# Patient Record
Sex: Female | Born: 1995 | Race: Black or African American | Hispanic: No | Marital: Single | Smoking: Light tobacco smoker
Health system: Southern US, Community
[De-identification: ages and names within clinical notes are randomized; demographics above are authoritative.]

## PROBLEM LIST (undated history)

## (undated) DIAGNOSIS — J020 Streptococcal pharyngitis: Secondary | ICD-10-CM

## (undated) DIAGNOSIS — F909 Attention-deficit hyperactivity disorder, unspecified type: Secondary | ICD-10-CM

## (undated) HISTORY — DX: Attention-deficit hyperactivity disorder, unspecified type: F90.9

---
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
## 2015-10-11 ENCOUNTER — Encounter: Payer: Self-pay | Admitting: Family Medicine

## 2015-10-11 ENCOUNTER — Ambulatory Visit (INDEPENDENT_AMBULATORY_CARE_PROVIDER_SITE_OTHER): Payer: 59 | Admitting: Family Medicine

## 2015-10-11 VITALS — BP 134/86 | Temp 99.3°F | Ht 64.0 in | Wt 377.0 lb

## 2015-10-11 DIAGNOSIS — Z139 Encounter for screening, unspecified: Secondary | ICD-10-CM | POA: Diagnosis not present

## 2015-10-11 DIAGNOSIS — J0100 Acute maxillary sinusitis, unspecified: Secondary | ICD-10-CM | POA: Diagnosis not present

## 2015-10-11 MED ORDER — NAPROXEN 500 MG PO TABS: ORAL_TABLET | ORAL | Status: DC

## 2015-10-11 MED ORDER — AZITHROMYCIN 250 MG PO TABS: ORAL_TABLET | ORAL | Status: DC

## 2015-10-11 NOTE — Progress Notes (Signed)
   Subjective:    Patient ID: Madison Gates, female    DO: 1995/12/17, 20 y.o.   MRM: [id number]  Sinusitis This is a new problem. The current episode started in the past 7 days. Associated symptoms include headaches, sinus pressure and a sore throat. (Runny nose) Treatments tried: Theraflu  The treatment provided no relief.   Usually no bp issues , blood pressure repeat within normal limits  7 days progression headache frontal congestion. Diminished energy , achiness occasional cough. Sore throat worse in the morning.    No sig fever  Hard time sleepoing. Nose stoppedd up  Felt bad Patient also has C/O of irregular menstruation. Patient states that she has been experiencing a menstrual cycle for 2 1/2 months.   Of an on menst cycles abnormalities  Not sexualy active,      Review of Systems  HENT: Positive for sinus pressure and sore throat.   Neurological: Positive for headaches.       Objective:   Physical Exam   alert mild malaise. H&T moderate his congestion frontal tenderness. Significant morbid obesity present. Blood pressure repeat good lungs clear heart regular in rhythm.      Assessment & Plan:   impression #1 rhinosinusitis #2 morbid obesity and worsening #3 menstrual irregularities chronic plan antibiotics prescribed. Symptom care discussed. Strongly encouraged screening physical  With Eber JonesCarolyn plus focus on menstrual irregularities and morbid obesity.we'll do blood work appropriate for age and obesity WSL

## 2015-10-30 ENCOUNTER — Encounter: Payer: 59 | Admitting: Nurse Practitioner

## 2015-12-11 ENCOUNTER — Other Ambulatory Visit: Payer: Self-pay | Admitting: Family Medicine

## 2015-12-12 MED ORDER — NAPROXEN 500 MG PO TABS: ORAL_TABLET | ORAL | Status: DC

## 2016-03-25 ENCOUNTER — Ambulatory Visit (INDEPENDENT_AMBULATORY_CARE_PROVIDER_SITE_OTHER): Payer: 59 | Admitting: Nurse Practitioner

## 2016-03-25 ENCOUNTER — Encounter: Payer: Self-pay | Admitting: Nurse Practitioner

## 2016-03-25 VITALS — BP 132/86 | Ht 64.0 in | Wt 374.8 lb

## 2016-03-25 DIAGNOSIS — N939 Abnormal uterine and vaginal bleeding, unspecified: Secondary | ICD-10-CM

## 2016-03-25 DIAGNOSIS — E162 Hypoglycemia, unspecified: Secondary | ICD-10-CM | POA: Diagnosis not present

## 2016-03-25 DIAGNOSIS — L83 Acanthosis nigricans: Secondary | ICD-10-CM

## 2016-03-25 DIAGNOSIS — Z13220 Encounter for screening for lipoid disorders: Secondary | ICD-10-CM | POA: Diagnosis not present

## 2016-03-25 MED ORDER — LEVONORGEST-ETH ESTRAD 91-DAY 0.15-0.03 &0.01 MG PO TABS: 1.0000 | ORAL_TABLET | Freq: Every day | ORAL | Status: DC

## 2016-03-25 NOTE — Progress Notes (Signed)
Subjective:  Presents for complaints of constant menstrual bleeding for the past 3 months. Varies in amount. Some cramping. No sexual partner for at least 2 years. Would like to start birth control pills. Denies any family history of diabetes. Light smoker mainly on the weekends. Also complaints of spells of "anxiety" that occur around 4 PM every day. Occurs in a variety of settings, has not noticed any specific triggers. Eats lunch around 12-1 PM daily. Usually eats fast food. Starts with feeling nauseated than a mild headache and slight tremors at times. No syncopal episodes.  Objective:   BP 132/86 mmHg  Ht 5\' 4"  (1.626 m)  Wt 374 lb 12.8 oz (170.008 kg)  BMI 64.30 kg/m2 NAD. Alert, oriented. Lungs clear. Heart regular rate rhythm. Extreme obesity noted. Acanthosis nigricans noted on the posterior neck area.  Assessment:  Problem List Items Addressed This Visit      Musculoskeletal and Integument   Acanthosis nigricans   Relevant Orders   Hepatic function panel   Hemoglobin A1c     Genitourinary   Abnormal uterine bleeding - Primary   Relevant Orders   CBC with Differential/Platelet     Other   Morbid obesity (HCC)   Relevant Orders   Hepatic function panel   TSH    Other Visit Diagnoses    Hypoglycemia   Probable     Relevant Orders    Hepatic function panel    Basic metabolic panel    TSH    Insulin, Fasting    Screening, lipid        Relevant Orders    Lipid panel      Plan:  Meds ordered this encounter  Medications  . Levonorgestrel-Ethinyl Estradiol (AMETHIA,CAMRESE) 0.15-0.03 &0.01 MG tablet    Sig: Take 1 tablet by mouth daily.    Dispense:  1 Package    Refill:  4    Order Specific Question:  Supervising Provider    Answer:  Riccardo DubinLUKING, WILLIAM S [[id number]]   Discuss birth control options. Wishes to start 3 month birth control pill. Cautioned about risk of blood clots which are increased with smoking. Explained that her anxiety symptoms may be due to low blood  sugar. Recommend a healthy high-protein snack around 3:00 in the afternoon to see if this will help stop symptoms. Lab work pending. Start birth control pill today.

## 2016-04-17 ENCOUNTER — Encounter: Payer: Self-pay | Admitting: Nurse Practitioner

## 2016-06-06 ENCOUNTER — Other Ambulatory Visit: Payer: Self-pay | Admitting: Family Medicine

## 2016-06-06 MED ORDER — NAPROXEN 500 MG PO TABS: ORAL_TABLET | ORAL | 0 refills | Status: DC

## 2016-07-30 ENCOUNTER — Encounter: Payer: Self-pay | Admitting: Nurse Practitioner

## 2016-11-13 ENCOUNTER — Encounter: Payer: Self-pay | Admitting: Nurse Practitioner

## 2016-11-18 ENCOUNTER — Other Ambulatory Visit: Payer: Self-pay | Admitting: Nurse Practitioner

## 2016-11-18 MED ORDER — NORETHIN-ETH ESTRAD-FE BIPHAS 1 MG-10 MCG / 10 MCG PO TABS: 1.0000 | ORAL_TABLET | Freq: Every day | ORAL | 2 refills | Status: DC

## 2017-01-16 ENCOUNTER — Encounter: Payer: Self-pay | Admitting: Nurse Practitioner

## 2017-01-20 ENCOUNTER — Other Ambulatory Visit: Payer: Self-pay | Admitting: Nurse Practitioner

## 2017-01-23 ENCOUNTER — Other Ambulatory Visit: Payer: Self-pay | Admitting: Nurse Practitioner

## 2017-01-23 MED ORDER — NORETHINDRONE-ETH ESTRADIOL 1-35 MG-MCG PO TABS
1.0000 | ORAL_TABLET | Freq: Every day | ORAL | 11 refills | Status: DC
Start: 2017-01-23 — End: 2017-10-14

## 2017-03-09 ENCOUNTER — Encounter (HOSPITAL_COMMUNITY): Payer: Self-pay | Admitting: Emergency Medicine

## 2017-03-09 ENCOUNTER — Emergency Department (HOSPITAL_COMMUNITY)
Admission: EM | Admit: 2017-03-09 | Discharge: 2017-03-09 | Disposition: A | Discharge status: Home / Self Care | Payer: Medicaid Other | Attending: Emergency Medicine | Admitting: Emergency Medicine

## 2017-03-09 DIAGNOSIS — F909 Attention-deficit hyperactivity disorder, unspecified type: Secondary | ICD-10-CM | POA: Insufficient documentation

## 2017-03-09 DIAGNOSIS — F17200 Nicotine dependence, unspecified, uncomplicated: Secondary | ICD-10-CM | POA: Insufficient documentation

## 2017-03-09 DIAGNOSIS — Z79899 Other long term (current) drug therapy: Secondary | ICD-10-CM | POA: Insufficient documentation

## 2017-03-09 DIAGNOSIS — J039 Acute tonsillitis, unspecified: Secondary | ICD-10-CM | POA: Insufficient documentation

## 2017-03-09 HISTORY — DX: Streptococcal pharyngitis: J02.0

## 2017-03-09 MED ORDER — AMOXICILLIN 500 MG PO CAPS: 500.0000 mg | ORAL_CAPSULE | Freq: Three times a day (TID) | ORAL | 0 refills | Status: DC

## 2017-03-09 NOTE — ED Triage Notes (Signed)
Pt c/o sorethroat x 2 days. Redness with white spots noted to throat. nad

## 2017-03-09 NOTE — ED Provider Notes (Signed)
[facility]EMERGENCY DEPT Provider Note   CSN: [id number] Arrival date & time: 03/09/17  1704     History   Chief Complaint Chief Complaint  Patient presents with  . Sore Throat    HPI Madison Gates is a 21 y.o. female.  The history is provided by the patient. No language interpreter was used.  Sore Throat  This is a new problem. The current episode started more than 2 days ago. The problem occurs constantly. The problem has not changed since onset.Pertinent negatives include no headaches. Nothing aggravates the symptoms. Nothing relieves the symptoms. She has tried nothing for the symptoms. The treatment provided no relief.    Past Medical History:  Diagnosis Date  . ADHD (attention deficit hyperactivity disorder)   . Strep throat     Patient Active Problem List   Diagnosis Date Noted  . Abnormal uterine bleeding 03/25/2016  . Morbid obesity (HCC) 03/25/2016  . Acanthosis nigricans 03/25/2016    History reviewed. No pertinent surgical history.  OB History    No data available       Home Medications    Prior to Admission medications   Medication Sig Start Date End Date Taking? Authorizing Provider  naproxen (NAPROSYN) 500 MG tablet Take 1 tablet BID as needed for pain. 06/06/16   Campbell RichesHoskins, Carolyn C, NP  norethindrone-ethinyl estradiol 1/35 (PIRMELLA 1/35) tablet Take 1 tablet by mouth daily. 01/23/17   Campbell RichesHoskins, Carolyn C, NP    Family History History reviewed. No pertinent family history.  Social History Social History  Substance Use Topics  . Smoking status: Light Tobacco Smoker  . Smokeless tobacco: Never Used  . Alcohol use No     Allergies   Concerta [methylphenidate]   Review of Systems Review of Systems  Neurological: Negative for headaches.  All other systems reviewed and are negative.    Physical Exam Updated Vital Signs BP 136/75 (BP Location: Left Arm)   Pulse (!) 113   Temp 99.6 F (37.6 C) (Oral)   Resp (!) 21   LMP  02/16/2017   SpO2 100%   Physical Exam  Constitutional: She appears well-developed and well-nourished. No distress.  HENT:  Head: Normocephalic and atraumatic.  Mouth/Throat: Oropharyngeal exudate present.  tonsilsl swollen   Eyes: Conjunctivae are normal.  Neck: Neck supple.  Cardiovascular: Normal rate and regular rhythm.   No murmur heard. Pulmonary/Chest: Effort normal and breath sounds normal. No respiratory distress.  Abdominal: Soft. There is no tenderness.  Musculoskeletal: She exhibits no edema.  Neurological: She is alert.  Skin: Skin is warm and dry.  Psychiatric: She has a normal mood and affect.  Nursing note and vitals reviewed.    ED Treatments / Results  Labs (all labs ordered are listed, but only abnormal results are displayed) Labs Reviewed - No data to display  EKG  EKG Interpretation None       Radiology No results found.  Procedures Procedures (including critical care time)  Medications Ordered in ED Medications - No data to display   Initial Impression / Assessment and Plan / ED Course  I have reviewed the triage vital signs and the nursing notes.  Pertinent labs & imaging results that were available during my care of the patient were reviewed by me and considered in my medical decision making (see chart for details).       Final Clinical Impressions(s) / ED Diagnoses   Final diagnoses:  Tonsillitis    New Prescriptions Discharge Medication List as of 03/09/2017  6:16 PM    START taking these medications   Details  amoxicillin (AMOXIL) 500 MG capsule Take 1 capsule (500 mg total) by mouth 3 (three) times daily., Starting Sun 03/09/2017, Print      An After Visit Summary was printed and given to the patient.   Elson Areas, PA-C 03/09/17 Zollie Pee    Eber Hong, MD 03/11/17 1014

## 2017-03-09 NOTE — Discharge Instructions (Signed)
Return if any problems.

## 2017-08-21 ENCOUNTER — Encounter: Payer: Self-pay | Admitting: Family Medicine

## 2017-10-14 ENCOUNTER — Ambulatory Visit (INDEPENDENT_AMBULATORY_CARE_PROVIDER_SITE_OTHER): Payer: Medicaid Other | Admitting: Family Medicine

## 2017-10-14 ENCOUNTER — Encounter: Payer: Self-pay | Admitting: Family Medicine

## 2017-10-14 VITALS — BP 134/84 | Ht 64.0 in | Wt 337.8 lb

## 2017-10-14 DIAGNOSIS — Z113 Encounter for screening for infections with a predominantly sexual mode of transmission: Secondary | ICD-10-CM

## 2017-10-14 DIAGNOSIS — Z124 Encounter for screening for malignant neoplasm of cervix: Secondary | ICD-10-CM | POA: Diagnosis not present

## 2017-10-14 DIAGNOSIS — Z0000 Encounter for general adult medical examination without abnormal findings: Secondary | ICD-10-CM

## 2017-10-14 DIAGNOSIS — Z0001 Encounter for general adult medical examination with abnormal findings: Secondary | ICD-10-CM | POA: Diagnosis not present

## 2017-10-14 DIAGNOSIS — Z13220 Encounter for screening for lipoid disorders: Secondary | ICD-10-CM

## 2017-10-14 DIAGNOSIS — R5383 Other fatigue: Secondary | ICD-10-CM

## 2017-10-14 DIAGNOSIS — L0292 Furuncle, unspecified: Secondary | ICD-10-CM

## 2017-10-14 DIAGNOSIS — L83 Acanthosis nigricans: Secondary | ICD-10-CM

## 2017-10-14 MED ORDER — DOXYCYCLINE HYCLATE 100 MG PO TABS: 100.0000 mg | ORAL_TABLET | Freq: Two times a day (BID) | ORAL | 0 refills | Status: DC

## 2017-10-14 NOTE — Progress Notes (Signed)
Subjective:    Patient ID: Madison Gates, female    DOB: 1995-10-12, 22 y.o.   MRN: [id number]  HPI The patient comes in today for a wellness visit.    A review of their health history was completed.  A review of medications was also completed.  Any needed refills; none  Eating habits: eating healthy  Falls/  MVA accidents in past few months: none  Regular exercise: yoga  Specialist pt sees on regular basis: none  Preventative health issues were discussed.   Additional concerns: STD testing- keeps getting boils under her arms. Has one now, draining somewhat at times  Was on birth conol came off, emotiona, using condoms. Took pills and did not help cycle and made mood worse and sad all the time   No hx of testing   Cycles fairly heavy, take s no vitamins   - Challenges with recurrent boils.  Currently experiencing 1.  Left armpit.  With drainage.  Patient wonders why she gets these of note had a blood work ordered over a year ago which she did not do   Work at [employer] still in school major in communication   Doing some exercise daily      Review of Systems  Constitutional: Negative for activity change, appetite change and fatigue.  HENT: Negative for congestion and rhinorrhea.   Eyes: Negative for discharge.  Respiratory: Negative for cough, chest tightness and wheezing.   Cardiovascular: Negative for chest pain.  Gastrointestinal: Negative for abdominal pain, blood in stool and vomiting.  Endocrine: Negative for polyphagia.  Genitourinary: Negative for difficulty urinating and frequency.  Musculoskeletal: Negative for neck pain.  Skin: Negative for color change.  Allergic/Immunologic: Negative for environmental allergies and food allergies.  Neurological: Negative for weakness and headaches.  Psychiatric/Behavioral: Negative for agitation and behavioral problems.  All other systems reviewed and are negative.      Objective:   Physical Exam   Constitutional: She is oriented to person, place, and time. She appears well-developed and well-nourished.  Obesity morbid  HENT:  Head: Normocephalic and atraumatic.  Right Ear: External ear normal.  Left Ear: External ear normal.  Eyes: Right eye exhibits no discharge. Left eye exhibits no discharge.  Neck: Normal range of motion. No tracheal deviation present.  Cardiovascular: Normal rate, regular rhythm, normal heart sounds and intact distal pulses. Exam reveals no gallop.  No murmur heard. Pulmonary/Chest: Effort normal and breath sounds normal. No stridor. No respiratory distress. She has no wheezes. She has no rales.  Abdominal: Soft. Bowel sounds are normal. She exhibits no distension and no mass. There is no tenderness. There is no rebound and no guarding.  Musculoskeletal: Normal range of motion. She exhibits no edema or tenderness.  Lymphadenopathy:    She has no cervical adenopathy.  Neurological: She is alert and oriented to person, place, and time. She exhibits normal muscle tone.  Skin: Skin is warm and dry.  Left axillary abscess with active disch mild tend no erythema  Psychiatric: She has a normal mood and affect. Her behavior is normal.  Vitals reviewed.         Assessment & Plan:  Impression 1 wellness exam.  Patient sexually active.  Not interested in hormonal contraceptives at this time.  Has stopped them.  Has partner use condoms aware not as effective STD testing requested.  No symptoms  2.  Recurrent abscesses current one present.  Will treat with antibiotics.  Appropriate blood work.  Will  check sugar and  3.  Morbid obesity strongly encouraged diet and exercise advised if this does not improve soon will be recommending bariatric intervention

## 2017-10-17 LAB — PAP IG, CT-NG, RFX HPV ASCU
CHLAMYDIA, NUC. ACID AMP: POSITIVE — AB
GONOCOCCUS BY NUCLEIC ACID AMP: POSITIVE — AB
PAP Smear Comment: 0

## 2017-10-17 LAB — SPECIMEN STATUS REPORT

## 2017-10-20 MED ORDER — AZITHROMYCIN 500 MG PO TABS: 1000.0000 mg | ORAL_TABLET | Freq: Once | ORAL | 0 refills | Status: AC

## 2017-10-20 MED ORDER — CEFIXIME 400 MG PO CAPS: ORAL_CAPSULE | ORAL | 0 refills | Status: DC

## 2017-10-20 NOTE — Addendum Note (Signed)
Addended by: Margaretha SheffieldBROWN, AUTUMN S on: 10/20/2017 03:05 PM   Modules accepted: Orders

## 2017-10-25 LAB — CBC WITH DIFFERENTIAL/PLATELET
BASOS: 1 %
Basophils Absolute: 0.1 10*3/uL (ref 0.0–0.2)
EOS (ABSOLUTE): 0.2 10*3/uL (ref 0.0–0.4)
EOS: 2 %
HEMATOCRIT: 40.1 % (ref 34.0–46.6)
HEMOGLOBIN: 13.3 g/dL (ref 11.1–15.9)
Immature Grans (Abs): 0 10*3/uL (ref 0.0–0.1)
Immature Granulocytes: 0 %
LYMPHS ABS: 2.4 10*3/uL (ref 0.7–3.1)
Lymphs: 32 %
MCH: 25.7 pg — ABNORMAL LOW (ref 26.6–33.0)
MCHC: 33.2 g/dL (ref 31.5–35.7)
MCV: 77 fL — AB (ref 79–97)
MONOCYTES: 7 %
MONOS ABS: 0.6 10*3/uL (ref 0.1–0.9)
NEUTROS ABS: 4.4 10*3/uL (ref 1.4–7.0)
Neutrophils: 58 %
Platelets: 272 10*3/uL (ref 150–379)
RBC: 5.18 x10E6/uL (ref 3.77–5.28)
RDW: 15 % (ref 12.3–15.4)
WBC: 7.5 10*3/uL (ref 3.4–10.8)

## 2017-10-25 LAB — HIV ANTIBODY (ROUTINE TESTING W REFLEX): HIV SCREEN 4TH GENERATION: NONREACTIVE

## 2017-10-25 LAB — HEMOGLOBIN A1C
ESTIMATED AVERAGE GLUCOSE: 105 mg/dL
Hgb A1c MFr Bld: 5.3 % (ref 4.8–5.6)

## 2017-10-25 LAB — BASIC METABOLIC PANEL
BUN / CREAT RATIO: 13 (ref 9–23)
BUN: 8 mg/dL (ref 6–20)
CO2: 22 mmol/L (ref 20–29)
CREATININE: 0.63 mg/dL (ref 0.57–1.00)
Calcium: 9.5 mg/dL (ref 8.7–10.2)
Chloride: 101 mmol/L (ref 96–106)
GFR, EST AFRICAN AMERICAN: 148 mL/min/{1.73_m2} (ref >59)
GFR, EST NON AFRICAN AMERICAN: 129 mL/min/{1.73_m2} (ref >59)
Glucose: 84 mg/dL (ref 65–99)
Potassium: 4.2 mmol/L (ref 3.5–5.2)
SODIUM: 140 mmol/L (ref 134–144)

## 2017-10-25 LAB — LIPID PANEL
CHOL/HDL RATIO: 4.1 ratio (ref 0.0–4.4)
Cholesterol, Total: 165 mg/dL (ref 100–199)
HDL: 40 mg/dL (ref >39)
LDL Calculated: 110 mg/dL — ABNORMAL HIGH (ref 0–99)
Triglycerides: 73 mg/dL (ref 0–149)
VLDL Cholesterol Cal: 15 mg/dL (ref 5–40)

## 2017-10-25 LAB — HEPATIC FUNCTION PANEL
ALK PHOS: 78 IU/L (ref 39–117)
ALT: 8 IU/L (ref 0–32)
AST: 9 IU/L (ref 0–40)
Albumin: 4.4 g/dL (ref 3.5–5.5)
BILIRUBIN TOTAL: 0.3 mg/dL (ref 0.0–1.2)
BILIRUBIN, DIRECT: 0.1 mg/dL (ref 0.00–0.40)
TOTAL PROTEIN: 7.5 g/dL (ref 6.0–8.5)

## 2017-10-25 LAB — RPR, QUANT+TP ABS (REFLEX): T Pallidum Abs: NEGATIVE

## 2017-10-25 LAB — RPR: RPR: REACTIVE — AB

## 2017-11-06 ENCOUNTER — Encounter: Payer: Self-pay | Admitting: Family Medicine

## 2017-11-13 ENCOUNTER — Encounter: Payer: Self-pay | Admitting: Family Medicine

## 2017-11-14 NOTE — Telephone Encounter (Signed)
Discussed with patient and advised her that the Health department would be contacting her with further instructions. Patient verbalized understanding.

## 2017-12-06 ENCOUNTER — Emergency Department (HOSPITAL_COMMUNITY)
Admission: EM | Admit: 2017-12-06 | Discharge: 2017-12-06 | Disposition: A | Discharge status: Home / Self Care | Payer: Self-pay | Attending: Emergency Medicine | Admitting: Emergency Medicine

## 2017-12-06 ENCOUNTER — Encounter (HOSPITAL_COMMUNITY): Payer: Self-pay

## 2017-12-06 DIAGNOSIS — F17200 Nicotine dependence, unspecified, uncomplicated: Secondary | ICD-10-CM | POA: Insufficient documentation

## 2017-12-06 DIAGNOSIS — J3489 Other specified disorders of nose and nasal sinuses: Secondary | ICD-10-CM | POA: Insufficient documentation

## 2017-12-06 DIAGNOSIS — J0111 Acute recurrent frontal sinusitis: Secondary | ICD-10-CM | POA: Insufficient documentation

## 2017-12-06 DIAGNOSIS — R05 Cough: Secondary | ICD-10-CM | POA: Insufficient documentation

## 2017-12-06 DIAGNOSIS — F909 Attention-deficit hyperactivity disorder, unspecified type: Secondary | ICD-10-CM | POA: Insufficient documentation

## 2017-12-06 MED ORDER — AMOXICILLIN-POT CLAVULANATE 875-125 MG PO TABS: 1.0000 | ORAL_TABLET | Freq: Two times a day (BID) | ORAL | 0 refills | Status: DC

## 2017-12-06 NOTE — ED Provider Notes (Signed)
[facility] Provider Note   CSN: [id number] Arrival date & time: 12/06/17  0844     History   Chief Complaint Chief Complaint  Patient presents with  . Facial Pain    HPI Madison Gates is a 22 y.o. female.  The history is provided by the patient. No language interpreter was used.  URI   This is a new problem. The problem has been gradually worsening. There has been no fever. Associated symptoms include sinus pain and cough. She has tried nothing for the symptoms. The treatment provided no relief.    Past Medical History:  Diagnosis Date  . ADHD (attention deficit hyperactivity disorder)   . Strep throat     Patient Active Problem List   Diagnosis Date Noted  . Abnormal uterine bleeding 03/25/2016  . Morbid obesity (HCC) 03/25/2016  . Acanthosis nigricans 03/25/2016    History reviewed. No pertinent surgical history.  OB History    No data available       Home Medications    Prior to Admission medications   Medication Sig Start Date End Date Taking? Authorizing Provider  amoxicillin-clavulanate (AUGMENTIN) 875-125 MG tablet Take 1 tablet by mouth every 12 (twelve) hours. 12/06/17   Elson AreasSofia, Diesel Lina K, PA-C  Cefixime (SUPRAX) 400 MG CAPS capsule One tablet PO now 10/20/17   Merlyn AlbertLuking, William S, MD  doxycycline (VIBRA-TABS) 100 MG tablet Take 1 tablet (100 mg total) by mouth 2 (two) times daily. 10/14/17   Merlyn AlbertLuking, William S, MD  naproxen (NAPROSYN) 500 MG tablet Take 1 tablet BID as needed for pain. 06/06/16   Campbell RichesHoskins, Carolyn C, NP    Family History No family history on file.  Social History Social History   Tobacco Use  . Smoking status: Light Tobacco Smoker  . Smokeless tobacco: Never Used  Substance Use Topics  . Alcohol use: No    Alcohol/week: 0.0 oz  . Drug use: No     Allergies   Concerta [methylphenidate]   Review of Systems Review of Systems  HENT: Positive for sinus pain.   Respiratory: Positive for cough.   All  other systems reviewed and are negative.    Physical Exam Updated Vital Signs BP (!) 137/96 (BP Location: Left Arm)   Pulse 79   Temp 97.8 F (36.6 C) (Oral)   Resp 18   Ht 5\' 3"  (1.6 m)   Wt (!) 145.2 kg (320 lb)   LMP 10/29/2017 (Approximate) Comment: pt says has had several negative pregnancy tests  SpO2 100%   BMI 56.69 kg/m   Physical Exam  Constitutional: She is oriented to person, place, and time. She appears well-developed and well-nourished. No distress.  HENT:  Head: Normocephalic and atraumatic.  Right Ear: External ear normal.  Left Ear: External ear normal.  Nose: Nose normal.  Mouth/Throat: Oropharynx is clear and moist.  Tender maxillary sinuses,  Eyes: Conjunctivae and EOM are normal. Pupils are equal, round, and reactive to light.  Neck: Normal range of motion. Neck supple.  Cardiovascular: Normal rate and regular rhythm.  No murmur heard. Pulmonary/Chest: Effort normal and breath sounds normal. No respiratory distress.  Abdominal: Soft. There is no tenderness.  Musculoskeletal: Normal range of motion. She exhibits no edema.  Neurological: She is alert and oriented to person, place, and time.  Skin: Skin is warm and dry.  Psychiatric: She has a normal mood and affect.  Nursing note and vitals reviewed.    ED Treatments / Results  Labs (all  labs ordered are listed, but only abnormal results are displayed) Labs Reviewed - No data to display  EKG  EKG Interpretation None       Radiology No results found.  Procedures Procedures (including critical care time)  Medications Ordered in ED Medications - No data to display   Initial Impression / Assessment and Plan / ED Course  I have reviewed the triage vital signs and the nursing notes.  Pertinent labs & imaging results that were available during my care of the patient were reviewed by me and considered in my medical decision making (see chart for details).     Pt reports she gets  frequent sinus infections.   Final Clinical Impressions(s) / ED Diagnoses   Final diagnoses:  Acute recurrent frontal sinusitis    ED Discharge Orders        Ordered    amoxicillin-clavulanate (AUGMENTIN) 875-125 MG tablet  Every 12 hours     12/06/17 0945    An After Visit Summary was printed and given to the patient.    Elson Areas, PA-C 12/06/17 1421    Mancel Bale, MD 12/07/17 [phone]

## 2017-12-06 NOTE — ED Triage Notes (Signed)
Pt reports sinus congestion and facial pain x 2 days.  Unknown if has had fever.

## 2017-12-06 NOTE — Discharge Instructions (Signed)
See Dr. Gerda Diss for recheck if symptoms persist

## 2018-03-27 ENCOUNTER — Encounter: Payer: Self-pay | Admitting: Family Medicine

## 2018-11-29 ENCOUNTER — Emergency Department (HOSPITAL_COMMUNITY): Payer: Self-pay

## 2018-11-29 ENCOUNTER — Encounter (HOSPITAL_COMMUNITY): Payer: Self-pay | Admitting: Emergency Medicine

## 2018-11-29 ENCOUNTER — Emergency Department (HOSPITAL_COMMUNITY)
Admission: EM | Admit: 2018-11-29 | Discharge: 2018-11-29 | Disposition: A | Discharge status: Home / Self Care | Payer: Self-pay | Attending: Emergency Medicine | Admitting: Emergency Medicine

## 2018-11-29 ENCOUNTER — Other Ambulatory Visit: Payer: Self-pay

## 2018-11-29 DIAGNOSIS — F17200 Nicotine dependence, unspecified, uncomplicated: Secondary | ICD-10-CM | POA: Insufficient documentation

## 2018-11-29 DIAGNOSIS — B349 Viral infection, unspecified: Secondary | ICD-10-CM | POA: Insufficient documentation

## 2018-11-29 LAB — COMPREHENSIVE METABOLIC PANEL
ALT: 11 U/L (ref 0–44)
AST: 12 U/L — ABNORMAL LOW (ref 15–41)
Albumin: 3.7 g/dL (ref 3.5–5.0)
Alkaline Phosphatase: 63 U/L (ref 38–126)
Anion gap: 7 (ref 5–15)
BUN: 10 mg/dL (ref 6–20)
CO2: 26 mmol/L (ref 22–32)
Calcium: 8.7 mg/dL — ABNORMAL LOW (ref 8.9–10.3)
Chloride: 106 mmol/L (ref 98–111)
Creatinine, Ser: 0.59 mg/dL (ref 0.44–1.00)
GFR calc Af Amer: >60 mL/min (ref >60)
GFR calc non Af Amer: >60 mL/min (ref >60)
Glucose, Bld: 95 mg/dL (ref 70–99)
Potassium: 3.8 mmol/L (ref 3.5–5.1)
Sodium: 139 mmol/L (ref 135–145)
Total Bilirubin: 0.4 mg/dL (ref 0.3–1.2)
Total Protein: 7.1 g/dL (ref 6.5–8.1)

## 2018-11-29 LAB — CBC
HCT: 39.3 % (ref 36.0–46.0)
Hemoglobin: 12.6 g/dL (ref 12.0–15.0)
MCH: 26.8 pg (ref 26.0–34.0)
MCHC: 32.1 g/dL (ref 30.0–36.0)
MCV: 83.4 fL (ref 80.0–100.0)
Platelets: 238 10*3/uL (ref 150–400)
RBC: 4.71 MIL/uL (ref 3.87–5.11)
RDW: 13.5 % (ref 11.5–15.5)
WBC: 6 10*3/uL (ref 4.0–10.5)
nRBC: 0 % (ref 0.0–0.2)

## 2018-11-29 LAB — URINALYSIS, ROUTINE W REFLEX MICROSCOPIC
Bilirubin Urine: NEGATIVE
Glucose, UA: NEGATIVE mg/dL
Hgb urine dipstick: NEGATIVE
Ketones, ur: NEGATIVE mg/dL
Leukocytes,Ua: NEGATIVE
Nitrite: NEGATIVE
Protein, ur: NEGATIVE mg/dL
Specific Gravity, Urine: 1.013 (ref 1.005–1.030)
pH: 8 (ref 5.0–8.0)

## 2018-11-29 LAB — GROUP A STREP BY PCR: Group A Strep by PCR: NOT DETECTED

## 2018-11-29 LAB — INFLUENZA PANEL BY PCR (TYPE A & B)
Influenza A By PCR: NEGATIVE
Influenza B By PCR: NEGATIVE

## 2018-11-29 LAB — PREGNANCY, URINE: Preg Test, Ur: NEGATIVE

## 2018-11-29 LAB — LIPASE, BLOOD: Lipase: 19 U/L (ref 11–51)

## 2018-11-29 MED ORDER — ONDANSETRON 4 MG PO TBDP: 4.0000 mg | ORAL_TABLET | Freq: Three times a day (TID) | ORAL | 0 refills | Status: DC | PRN

## 2018-11-29 MED ORDER — ACETAMINOPHEN 325 MG PO TABS
650.0000 mg | ORAL_TABLET | Freq: Once | ORAL | Status: AC
  Administered 2018-11-29: 650 mg via ORAL
  Filled 2018-11-29: qty 2

## 2018-11-29 MED ORDER — IBUPROFEN 400 MG PO TABS
400.0000 mg | ORAL_TABLET | Freq: Once | ORAL | Status: AC
  Administered 2018-11-29: 400 mg via ORAL
  Filled 2018-11-29: qty 1

## 2018-11-29 MED ORDER — SODIUM CHLORIDE 0.9% FLUSH: 3.0000 mL | Freq: Once | INTRAVENOUS | Status: DC

## 2018-11-29 MED ORDER — ONDANSETRON 8 MG PO TBDP
8.0000 mg | ORAL_TABLET | Freq: Once | ORAL | Status: AC
  Administered 2018-11-29: 8 mg via ORAL
  Filled 2018-11-29: qty 1

## 2018-11-29 NOTE — ED Triage Notes (Signed)
Patient complains of cough, abdominal pain, nausea, and diarrhea x 3 days.

## 2018-11-29 NOTE — Discharge Instructions (Signed)
Take the prescription as directed.  Increase your fluid intake (ie:  Gatoraide) for the next few days.  Eat a bland diet and advance to your regular diet slowly as you can tolerate it.   Avoid full strength juices, as well as milk and milk products until your diarrhea has resolved. Take over the counter tylenol and ibuprofen, as directed on packaging, as needed for discomfort.  Gargle with warm water several times per day to help with discomfort.  May also use over the counter sore throat pain medicines such as chloraseptic or sucrets, as directed on packaging, as needed for discomfort.  Call your regular medical doctor tomorrow to schedule a follow up appointment this week.  Return to the Emergency Department immediately if worsening.

## 2018-11-29 NOTE — ED Provider Notes (Signed)
[facility] EMERGENCY DEPARTMENT Provider Note   CSN: [id number] Arrival date & time: 11/29/18  1038    History   Chief Complaint Chief Complaint  Patient presents with  . Abdominal Pain  . Diarrhea    HPI Madison Gates is a 23 y.o. female.     HPI  Pt was seen at 1235.   Per pt, c/o gradual onset and persistence of constant sneezing, sore throat, runny/stuffy nose, sinus congestion, and cough for the past 2-3 days. Has been associated with nausea and several episodes of diarrhea. Pt states she has been exposed to several sick contacts with "the flu." Denies fevers, no rash, no CP/SOB, no vomiting, no abd pain.    Past Medical History:  Diagnosis Date  . ADHD (attention deficit hyperactivity disorder)   . Strep throat     Patient Active Problem List   Diagnosis Date Noted  . Abnormal uterine bleeding 03/25/2016  . Morbid obesity (HCC) 03/25/2016  . Acanthosis nigricans 03/25/2016    History reviewed. No pertinent surgical history.   OB History   No obstetric history on file.      Home Medications    Prior to Admission medications   Medication Sig Start Date End Date Taking? Authorizing Provider  amoxicillin-clavulanate (AUGMENTIN) 875-125 MG tablet Take 1 tablet by mouth every 12 (twelve) hours. 12/06/17   Elson Areas, PA-C  Cefixime (SUPRAX) 400 MG CAPS capsule One tablet PO now 10/20/17   Merlyn Albert, MD  doxycycline (VIBRA-TABS) 100 MG tablet Take 1 tablet (100 mg total) by mouth 2 (two) times daily. 10/14/17   Merlyn Albert, MD  naproxen (NAPROSYN) 500 MG tablet Take 1 tablet BID as needed for pain. 06/06/16   Campbell Riches, NP    Family History No family history on file.  Social History Social History   Tobacco Use  . Smoking status: Light Tobacco Smoker  . Smokeless tobacco: Never Used  Substance Use Topics  . Alcohol use: No    Alcohol/week: 0.0 standard drinks  . Drug use: No     Allergies   Concerta  [methylphenidate]   Review of Systems Review of Systems ROS: Statement: All systems negative except as marked or noted in the HPI; Constitutional: Negative for fever and chills. ; ; Eyes: Negative for eye pain, redness and discharge. ; ; ENMT: Negative for ear pain, hoarseness, +sneezing, nasal congestion, sinus pressure and sore throat. ; ; Cardiovascular: Negative for chest pain, palpitations, diaphoresis, dyspnea and peripheral edema. ; ; Respiratory: +cough. Negative for wheezing and stridor. ; ; Gastrointestinal: +nausea, diarrhea. Negative for vomiting, abdominal pain, blood in stool, hematemesis, jaundice and rectal bleeding. . ; ; Genitourinary: Negative for dysuria, flank pain and hematuria. ; ; Musculoskeletal: Negative for back pain and neck pain. Negative for swelling and trauma.; ; Skin: Negative for pruritus, rash, abrasions, blisters, bruising and skin lesion.; ; Neuro: Negative for headache, lightheadedness and neck stiffness. Negative for weakness, altered level of consciousness, altered mental status, extremity weakness, paresthesias, involuntary movement, seizure and syncope.       Physical Exam Updated Vital Signs BP (!) 149/95 (BP Location: Right Arm)   Pulse 77   Temp 98.2 F (36.8 C) (Oral)   Resp 16   Ht 5\' 4"  (1.626 m)   Wt (!) 147.4 kg   LMP 11/15/2018   SpO2 100%   BMI 55.79 kg/m   Physical Exam 1240: Physical examination:  Nursing notes reviewed; Vital signs and O2 SAT reviewed;  Constitutional: Well developed, Well nourished, Well hydrated, In no acute distress; Head:  Normocephalic, atraumatic; Eyes: EOMI, PERRL, No scleral icterus; ENMT: TM's clear bilat. +edemetous nasal turbinates bilat with clear rhinorrhea. Mouth and pharynx without lesions. No tonsillar exudates. No intra-oral edema. No submandibular or sublingual edema. No hoarse voice, no drooling, no stridor. No pain with manipulation of larynx. No trismus. Mouth and pharynx normal, Mucous membranes  moist; Neck: Supple, Full range of motion, No lymphadenopathy; Cardiovascular: Regular rate and rhythm, No gallop; Respiratory: Breath sounds clear & equal bilaterally, No wheezes.  Speaking full sentences with ease, Normal respiratory effort/excursion; Chest: Nontender, Movement normal; Abdomen: Soft, Nontender, Nondistended, Normal bowel sounds; Genitourinary: No CVA tenderness; Extremities: Peripheral pulses normal, No tenderness, No edema, No calf edema or asymmetry.; Neuro: AA&Ox3, Major CN grossly intact.  Speech clear. No gross focal motor or sensory deficits in extremities.; Skin: Color normal, Warm, Dry.   ED Treatments / Results  Labs (all labs ordered are listed, but only abnormal results are displayed)   EKG None  Radiology   Procedures Procedures (including critical care time)  Medications Ordered in ED Medications  ondansetron (ZOFRAN-ODT) disintegrating tablet 8 mg (8 mg Oral Given 11/29/18 1256)  ibuprofen (ADVIL,MOTRIN) tablet 400 mg (400 mg Oral Given 11/29/18 1256)  acetaminophen (TYLENOL) tablet 650 mg (650 mg Oral Given 11/29/18 1256)     Initial Impression / Assessment and Plan / ED Course  I have reviewed the triage vital signs and the nursing notes.  Pertinent labs & imaging results that were available during my care of the patient were reviewed by me and considered in my medical decision making (see chart for details).     MDM Reviewed: previous chart, nursing note and vitals Reviewed previous: labs Interpretation: labs and x-ray   Results for orders placed or performed during the hospital encounter of 11/29/18  Group A Strep by PCR  Result Value Ref Range   Group A Strep by PCR NOT DETECTED NOT DETECTED  Lipase, blood  Result Value Ref Range   Lipase 19 11 - 51 U/L  Comprehensive metabolic panel  Result Value Ref Range   Sodium 139 135 - 145 mmol/L   Potassium 3.8 3.5 - 5.1 mmol/L   Chloride 106 98 - 111 mmol/L   CO2 26 22 - 32 mmol/L    Glucose, Bld 95 70 - 99 mg/dL   BUN 10 6 - 20 mg/dL   Creatinine, Ser 0.35 0.44 - 1.00 mg/dL   Calcium 8.7 (L) 8.9 - 10.3 mg/dL   Total Protein 7.1 6.5 - 8.1 g/dL   Albumin 3.7 3.5 - 5.0 g/dL   AST 12 (L) 15 - 41 U/L   ALT 11 0 - 44 U/L   Alkaline Phosphatase 63 38 - 126 U/L   Total Bilirubin 0.4 0.3 - 1.2 mg/dL   GFR calc non Af Amer >60 >60 mL/min   GFR calc Af Amer >60 >60 mL/min   Anion gap 7 5 - 15  CBC  Result Value Ref Range   WBC 6.0 4.0 - 10.5 K/uL   RBC 4.71 3.87 - 5.11 MIL/uL   Hemoglobin 12.6 12.0 - 15.0 g/dL   HCT 59.7 41.6 - 38.4 %   MCV 83.4 80.0 - 100.0 fL   MCH 26.8 26.0 - 34.0 pg   MCHC 32.1 30.0 - 36.0 g/dL   RDW 53.6 46.8 - 03.2 %   Platelets 238 150 - 400 K/uL   nRBC 0.0 0.0 - 0.2 %  Urinalysis, Routine w reflex microscopic  Result Value Ref Range   Color, Urine STRAW (A) YELLOW   APPearance CLEAR CLEAR   Specific Gravity, Urine 1.013 1.005 - 1.030   pH 8.0 5.0 - 8.0   Glucose, UA NEGATIVE NEGATIVE mg/dL   Hgb urine dipstick NEGATIVE NEGATIVE   Bilirubin Urine NEGATIVE NEGATIVE   Ketones, ur NEGATIVE NEGATIVE mg/dL   Protein, ur NEGATIVE NEGATIVE mg/dL   Nitrite NEGATIVE NEGATIVE   Leukocytes,Ua NEGATIVE NEGATIVE  Pregnancy, urine  Result Value Ref Range   Preg Test, Ur NEGATIVE NEGATIVE  Influenza panel by PCR (type A & B)  Result Value Ref Range   Influenza A By PCR NEGATIVE NEGATIVE   Influenza B By PCR NEGATIVE NEGATIVE   Dg Abd Acute W/chest Result Date: 11/29/2018 CLINICAL DATA:  Cough.  Nausea and diarrhea EXAM: DG ABDOMEN ACUTE W/ 1V CHEST COMPARISON:  None. FINDINGS: There is no evidence of dilated bowel loops or free intraperitoneal air. No radiopaque calculi or other significant radiographic abnormality is seen. Heart size and mediastinal contours are within normal limits. Both lungs are clear. IMPRESSION: Negative abdominal radiographs.  No acute cardiopulmonary disease. Electronically Signed   By: Marlan Palauharles  Clark M.D.   On: 11/29/2018  14:07     1415:  Pt has tol PO well while in the ED without N/V.  No stooling while in the ED.  Abd remains benign, VSS. Feels better and wants to go home now. Tx symptomatically at this time. Dx and testing d/w pt.  Questions answered.  Verb understanding, agreeable to d/c home with outpt f/u.    Final Clinical Impressions(s) / ED Diagnoses   Final diagnoses:  None    ED Discharge Orders    None       Samuel JesterMcManus, Ilia Dimaano, DO 12/04/18 0725

## 2019-07-21 ENCOUNTER — Encounter: Payer: Self-pay | Admitting: Family Medicine

## 2019-08-13 ENCOUNTER — Ambulatory Visit (INDEPENDENT_AMBULATORY_CARE_PROVIDER_SITE_OTHER): Payer: Medicaid Other | Admitting: Nurse Practitioner

## 2019-08-13 DIAGNOSIS — Z30011 Encounter for initial prescription of contraceptive pills: Secondary | ICD-10-CM

## 2019-08-13 DIAGNOSIS — N939 Abnormal uterine and vaginal bleeding, unspecified: Secondary | ICD-10-CM | POA: Diagnosis not present

## 2019-08-13 DIAGNOSIS — Z3009 Encounter for other general counseling and advice on contraception: Secondary | ICD-10-CM

## 2019-08-13 MED ORDER — LO LOESTRIN FE 1 MG-10 MCG / 10 MCG PO TABS: 1.0000 | ORAL_TABLET | Freq: Every day | ORAL | 2 refills | Status: DC

## 2019-08-13 NOTE — Progress Notes (Signed)
  VIRTUAL Subjective:    Patient ID: Madison Gates, female    DOB: [birth year], 23 y.o.   MRN: [id number]  HPI pt states she will have a regular 7 day cycle and then after 7 days she still has light bleeding that never goes away. Pt states on the heaviest days on her cycle, she changes her tampon every 1-2 hours. She has some abdominal cramping during menstrual cycle. This started 2 -3 months ago. LMP was last weekend, currently still on menses. Pt denies pelvic pain or abdominal pain. Pt is in a monogamous relationship with boyfriend. Pt does not use birth control. Pt has used birth control in the past, no significant problems. However, she would like to try. Denies tobacco use.   Review of Systems  General: Denies dizziness, lightheadedness, or fatigue.  Genitourinary: Positive for menstrual bleeding and menstrual problem. Negative for flank pain and pelvic pain.    Virtual Visit via Video Note  I connected with Madison Gates on 08/13/19 at  3:40 PM EST by a video enabled telemedicine application and verified that I am speaking with the correct person using two identifiers.  Location: Patient: home Provider: office   I discussed the limitations of evaluation and management by telemedicine and the availability of in person appointments. The patient expressed understanding and agreed to proceed.  History of Present Illness:    Observations/Objective:  General: Alert and oriented. Well-appearing woman. Cheerful affect.  Format  Patient present at home Provider present at office Consent for interaction obtained Coronavirus outbreak made virtual visit necessary   Assessment and Plan: Problem List Items Addressed This Visit      Genitourinary   Abnormal uterine bleeding - Primary     Meds ordered this encounter  Medications  . Norethindrone-Ethinyl Estradiol-Fe Biphas (LO LOESTRIN FE) 1 MG-10 MCG / 10 MCG tablet    Sig: Take 1 tablet by mouth daily.   Dispense:  1 Package    Refill:  2    Order Specific Question:   Supervising Provider    Answer:   Sallee Lange A [id number]   -Start birth control this Sunday. Educated about using condoms during the first week starting birth control. Reviewed side effects with patients. Educate patient that it can take up to 3 months to see full desired effects of medications. If bleeding unchanged or worsens, please call office for further recommendations. Educated about the importance of receiving flu vaccine. Recommend scheduling a well-woman exam. Discussed safe sex issues.   Follow Up Instructions: Return for well-woman exam.  I discussed the assessment and treatment plan with the patient. The patient was provided an opportunity to ask questions and all were answered. The patient agreed with the plan and demonstrated an understanding of the instructions.   The patient was advised to call back or seek an in-person evaluation if the symptoms worsen or if the condition fails to improve as anticipated.  I provided 15 minutes of non-face-to-face time during this encounter.

## 2019-08-13 NOTE — Progress Notes (Signed)
   Subjective:    Patient ID: Madison Gates, female    DOB: September 13, 1996, 23 y.o.   MRN: [id number]  HPIpt states she will have a regular 7 day cycle and then after 7 days she still has light bleeding that never goes away. Started 2 -3 months ago.   Virtual Visit via Video Note  I connected with Madison Gates on 08/13/19 at  3:40 PM EST by a video enabled telemedicine application and verified that I am speaking with the correct person using two identifiers.  Location: Patient: home Provider: office   I discussed the limitations of evaluation and management by telemedicine and the availability of in person appointments. The patient expressed understanding and agreed to proceed.  History of Present Illness:    Observations/Objective:   Assessment and Plan:   Follow Up Instructions:    I discussed the assessment and treatment plan with the patient. The patient was provided an opportunity to ask questions and all were answered. The patient agreed with the plan and demonstrated an understanding of the instructions.   The patient was advised to call back or seek an in-person evaluation if the symptoms worsen or if the condition fails to improve as anticipated.         Review of Systems     Objective:   Physical Exam        Assessment & Plan:

## 2019-08-14 ENCOUNTER — Encounter: Payer: Self-pay | Admitting: Nurse Practitioner

## 2019-11-03 ENCOUNTER — Encounter: Payer: Self-pay | Admitting: Family Medicine

## 2019-11-04 ENCOUNTER — Encounter: Payer: Self-pay | Admitting: Family Medicine

## 2019-11-04 ENCOUNTER — Ambulatory Visit: Payer: Medicaid Other | Attending: Internal Medicine

## 2019-12-30 ENCOUNTER — Other Ambulatory Visit: Payer: Self-pay | Admitting: Nurse Practitioner

## 2020-02-04 ENCOUNTER — Other Ambulatory Visit: Payer: Self-pay

## 2020-02-04 ENCOUNTER — Ambulatory Visit (INDEPENDENT_AMBULATORY_CARE_PROVIDER_SITE_OTHER): Payer: Medicaid Other | Admitting: Nurse Practitioner

## 2020-02-04 ENCOUNTER — Encounter: Payer: Self-pay | Admitting: Nurse Practitioner

## 2020-02-04 ENCOUNTER — Telehealth: Payer: Self-pay | Admitting: *Deleted

## 2020-02-04 VITALS — BP 110/80 | Temp 97.4°F | Ht 64.5 in | Wt 330.8 lb

## 2020-02-04 DIAGNOSIS — Z113 Encounter for screening for infections with a predominantly sexual mode of transmission: Secondary | ICD-10-CM | POA: Diagnosis not present

## 2020-02-04 DIAGNOSIS — Z79899 Other long term (current) drug therapy: Secondary | ICD-10-CM

## 2020-02-04 DIAGNOSIS — K219 Gastro-esophageal reflux disease without esophagitis: Secondary | ICD-10-CM

## 2020-02-04 DIAGNOSIS — L83 Acanthosis nigricans: Secondary | ICD-10-CM

## 2020-02-04 DIAGNOSIS — Z01419 Encounter for gynecological examination (general) (routine) without abnormal findings: Secondary | ICD-10-CM

## 2020-02-04 DIAGNOSIS — L732 Hidradenitis suppurativa: Secondary | ICD-10-CM

## 2020-02-04 DIAGNOSIS — R5383 Other fatigue: Secondary | ICD-10-CM

## 2020-02-04 MED ORDER — DOXYCYCLINE HYCLATE 100 MG PO CAPS: ORAL_CAPSULE | ORAL | 0 refills | Status: DC

## 2020-02-04 MED ORDER — PHENTERMINE HCL 37.5 MG PO TABS: 37.5000 mg | ORAL_TABLET | Freq: Every day | ORAL | 0 refills | Status: DC

## 2020-02-04 MED ORDER — FAMOTIDINE 40 MG PO TABS: 40.0000 mg | ORAL_TABLET | Freq: Every day | ORAL | 2 refills | Status: DC

## 2020-02-04 NOTE — Telephone Encounter (Signed)
Left message for pt to return call to let her know Eber Jones ordered blood work after her visit.

## 2020-02-04 NOTE — Progress Notes (Addendum)
Subjective:    Patient ID: Madison Gates, female    DOB: 02-19-1996, 24 y.o.   MRN: [id number]  HPI Patient presents today for her wellness exam.     Review of Systems  Constitutional: Negative for appetite change, fatigue and fever.  HENT: Negative for ear discharge, ear pain and hearing loss.   Eyes: Negative for pain, discharge and redness.       Patient reports left eye has poor vision.  Respiratory: Negative for cough, chest tightness, shortness of breath and wheezing.   Cardiovascular: Negative for chest pain and palpitations.  Gastrointestinal: Positive for abdominal pain, diarrhea, nausea and vomiting. Negative for abdominal distention, anal bleeding, blood in stool, constipation and rectal pain.       Endorses recurrent episodes of morning nausea, emesis, and diarrhea over the last year. Mainly occurs after eating spicy foods.   Endocrine: Negative for cold intolerance and heat intolerance.  Genitourinary: Positive for vaginal bleeding. Negative for difficulty urinating, flank pain, pelvic pain, vaginal discharge and vaginal pain.       Patient reports she is currently on her cycle  Neurological: Negative for dizziness, weakness, light-headedness and headaches.  Psychiatric/Behavioral: Negative for agitation, behavioral problems, confusion and suicidal ideas. The patient is not nervous/anxious.   Social: Patient reports she uses a Vape pen daily with nicotine and flavoring. Patient endorses recreational marijuana use. Denies other illicit substances, denies alcohol use.      Objective:   Physical Exam Constitutional:      General: She is awake.     Appearance: Normal appearance. She is well-developed. She is morbidly obese. She is not ill-appearing, toxic-appearing or diaphoretic.  HENT:     Right Ear: Hearing, ear canal and external ear normal. No drainage or swelling. There is no impacted cerumen.     Left Ear: Hearing, ear canal and external ear normal. No  drainage or swelling. There is no impacted cerumen. Tympanic membrane is retracted.  Neck:     Thyroid: No thyroid mass, thyromegaly or thyroid tenderness.  Cardiovascular:     Rate and Rhythm: Normal rate and regular rhythm.     Heart sounds: Normal heart sounds, S1 normal and S2 normal.  Pulmonary:     Effort: Pulmonary effort is normal. No tachypnea, bradypnea or respiratory distress.     Breath sounds: Normal breath sounds. No stridor or decreased air movement.  Chest:     Breasts: Tanner Score is 5. Breasts are symmetrical.        Right: Normal. No bleeding, inverted nipple, mass or tenderness.        Left: Normal. No bleeding, inverted nipple, mass or tenderness.    Abdominal:     General: Abdomen is flat. Bowel sounds are normal. There is no distension. There are no signs of injury.     Palpations: Abdomen is soft. There is no fluid wave or mass.     Tenderness: There is no abdominal tenderness. There is no rebound.     Hernia: No hernia is present.  Lymphadenopathy:     Cervical: No cervical adenopathy.     Upper Body:     Right upper body: No supraclavicular or axillary adenopathy.     Left upper body: No supraclavicular or axillary adenopathy.  Neurological:     Mental Status: She is alert.  Psychiatric:        Attention and Perception: Attention and perception normal.        Mood and Affect: Mood and affect  normal.        Speech: Speech normal.        Behavior: Behavior normal. Behavior is cooperative.        Cognition and Memory: Cognition and memory normal.        Judgment: Judgment normal.   Defers pelvic exam due to cycle. Denies any problems.      Assessment & Plan:   Problem List Items Addressed This Visit      Digestive   Gastroesophageal reflux disease without esophagitis   Relevant Medications   famotidine (PEPCID) 40 MG tablet     Musculoskeletal and Integument   Acanthosis nigricans   Relevant Orders   Basic metabolic panel   Insulin, Free and  Total   Hemoglobin A1c   Hydradenitis     Other   Morbid obesity (HCC)   Relevant Medications   phentermine (ADIPEX-P) 37.5 MG tablet    Other Visit Diagnoses    Well woman exam    -  Primary   Screening for STD (sexually transmitted disease)       Relevant Orders   RPR+HIV+GC+CT Panel   Fatigue, unspecified type       Relevant Orders   TSH   CBC with Differential/Platelet   VITAMIN D 25 Hydroxy (Vit-D Deficiency, Fractures)   High risk medication use       Relevant Orders   Hepatic function panel      Meds ordered this encounter  Medications  . phentermine (ADIPEX-P) 37.5 MG tablet    Sig: Take 1 tablet (37.5 mg total) by mouth daily before breakfast.    Dispense:  30 tablet    Refill:  0    Order Specific Question:   Supervising Provider    Answer:   [place]  . doxycycline (VIBRAMYCIN) 100 MG capsule    Sig: Take 1 capsule PO BID PRN skin Infection    Dispense:  60 capsule    Refill:  0    Order Specific Question:   Supervising Provider    Answer:   Sallee Lange A [[id number]]  . famotidine (PEPCID) 40 MG tablet    Sig: Take 1 tablet (40 mg total) by mouth daily. PRN Acid Reflux    Dispense:  30 tablet    Refill:  2    Order Specific Question:   Supervising Provider    Answer:   Sallee Lange A [[id number]]    Patient to continue on Lo loestrin at this time. Patient to be started on Doxycycline PO BID PRN for skin infections noted in the axilla region and reportedly between her thighs. Patient to start phentermine for weight loss, and shared goal making of reduction os sugary beverages and 20 minutes of active exercise x4 days a week. Patient counseled on options in the future is weight loss does not occur, such a bariatric surgery. Patient also started on Pepcid PRN for acid reflux to see if this will aid in the GI upset. Counseled patient that if this does not help, then a check of her gallbladder may be beneficial. Counseled patient on the importance of using  back up method of birth control while taking the doxycycline and importance of abstaining from becoming pregnant while on Phentermine and Doxycycline.   Patient to follow up in one month. Call back sooner if any problems.

## 2020-02-04 NOTE — Progress Notes (Signed)
   Subjective:    Patient ID: Madison Gates, female    DOB: 05/24/1996, 24 y.o.   MRN: [id number]  HPI  The patient comes in today for a wellness visit.    A review of their health history was completed.  A review of medications was also completed.  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   Additional concerns: Patient states she is experiencing stomach pains in the morning that causes nausea/vomiting and will start to feel better the rest of the day. This was happening quite frequently and then stopped. As occurred twice this week. Patient thinks it may be related to spicy foods.  Patient wants physical exam but defers PAP due to her menstrual cycle.  Review of Systems     Objective:   Physical Exam        Assessment & Plan:

## 2020-02-05 DIAGNOSIS — K219 Gastro-esophageal reflux disease without esophagitis: Secondary | ICD-10-CM | POA: Insufficient documentation

## 2020-10-01 ENCOUNTER — Other Ambulatory Visit: Payer: Self-pay | Admitting: Nurse Practitioner

## 2020-10-05 NOTE — Telephone Encounter (Signed)
Please send back after pt makes appt. thanks

## 2020-10-05 NOTE — Telephone Encounter (Signed)
Sent my chart message to schedule appointment.

## 2020-10-09 ENCOUNTER — Other Ambulatory Visit: Payer: Self-pay | Admitting: *Deleted

## 2020-10-11 ENCOUNTER — Telehealth: Payer: Self-pay | Admitting: Family Medicine

## 2020-10-11 NOTE — Telephone Encounter (Signed)
Patient needing labs done for physical on 1/28

## 2020-10-12 NOTE — Telephone Encounter (Signed)
Patient has appointment schedule on 1/28

## 2020-10-13 ENCOUNTER — Encounter: Payer: Self-pay | Admitting: Nurse Practitioner

## 2020-10-13 ENCOUNTER — Other Ambulatory Visit: Payer: Self-pay | Admitting: Nurse Practitioner

## 2020-10-13 DIAGNOSIS — Z13220 Encounter for screening for lipoid disorders: Secondary | ICD-10-CM

## 2020-10-13 DIAGNOSIS — Z1321 Encounter for screening for nutritional disorder: Secondary | ICD-10-CM

## 2020-10-13 DIAGNOSIS — L83 Acanthosis nigricans: Secondary | ICD-10-CM

## 2020-10-13 DIAGNOSIS — N939 Abnormal uterine and vaginal bleeding, unspecified: Secondary | ICD-10-CM

## 2020-10-13 DIAGNOSIS — K219 Gastro-esophageal reflux disease without esophagitis: Secondary | ICD-10-CM

## 2020-10-13 NOTE — Telephone Encounter (Signed)
Labs have been ordered.  Please notify patient.  Thanks.

## 2020-10-15 IMAGING — DX DG ABDOMEN ACUTE W/ 1V CHEST
4 series · 4 of 4 positions shown · non-contrast
Comparison: None.

CLINICAL DATA: Cough.  Nausea and diarrhea

EXAM:
DG ABDOMEN ACUTE W/ 1V CHEST

[chest pa]
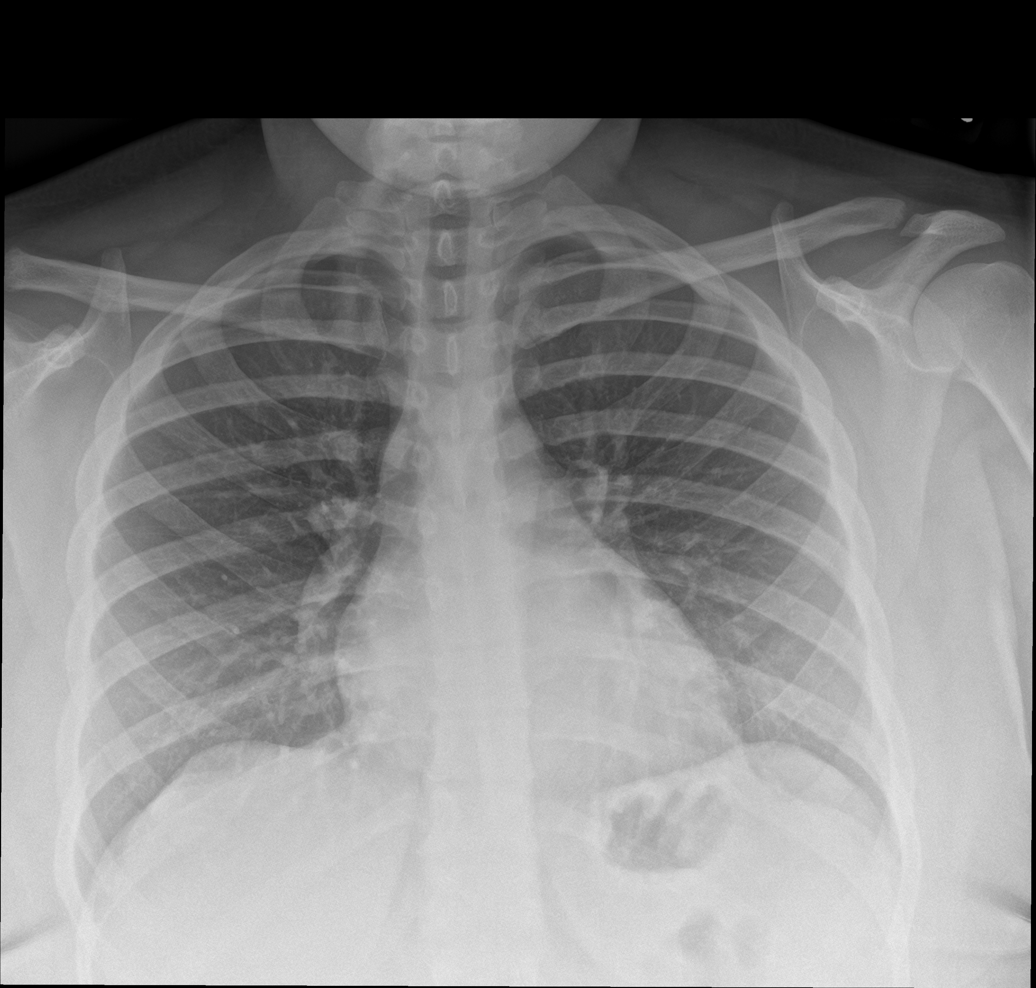

[abdomen erect]
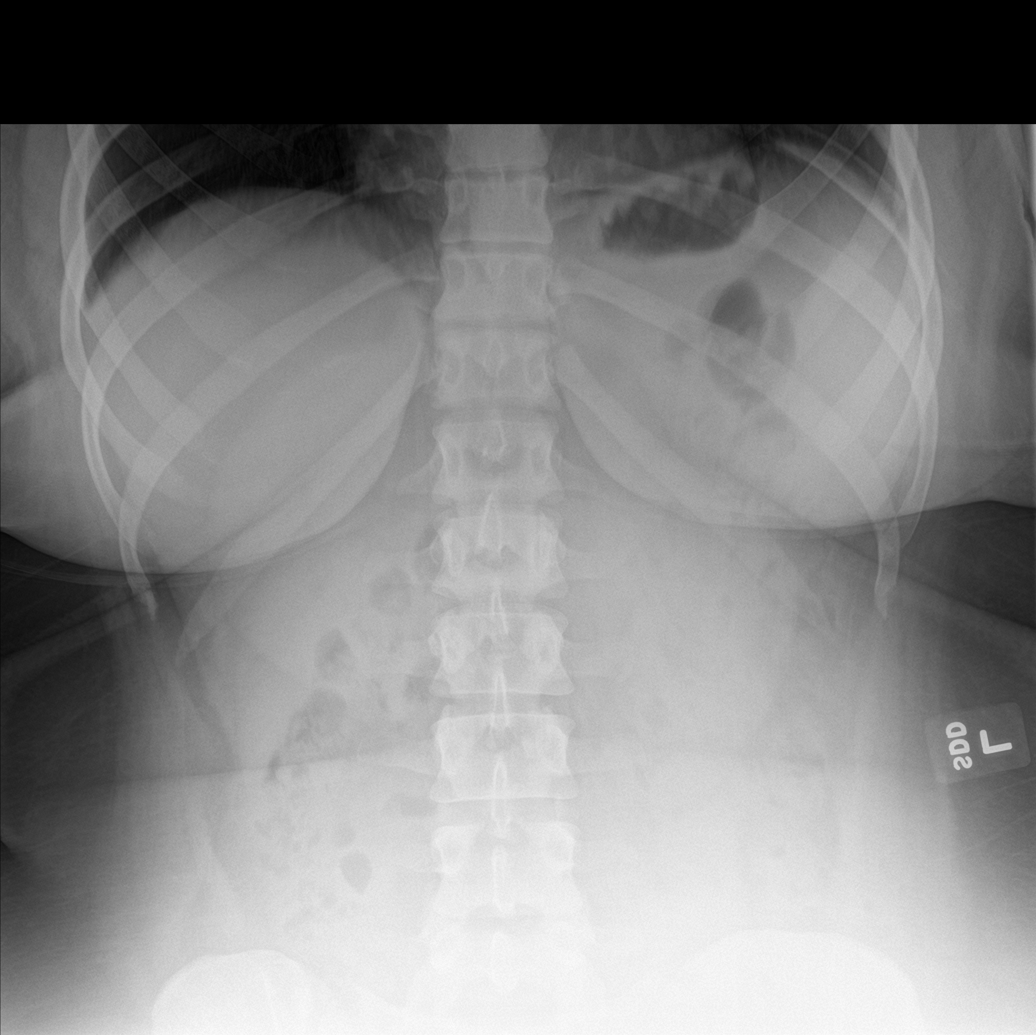

[abdomen supine (1 of 2)]
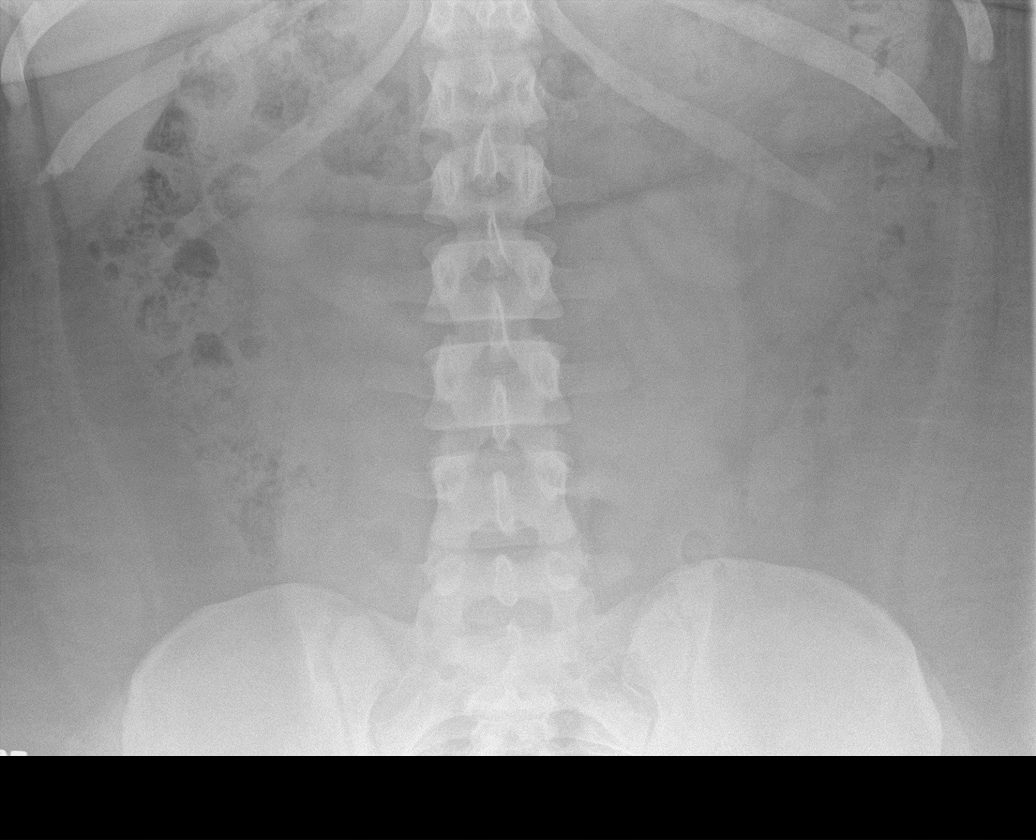

[abdomen supine (2 of 2)]
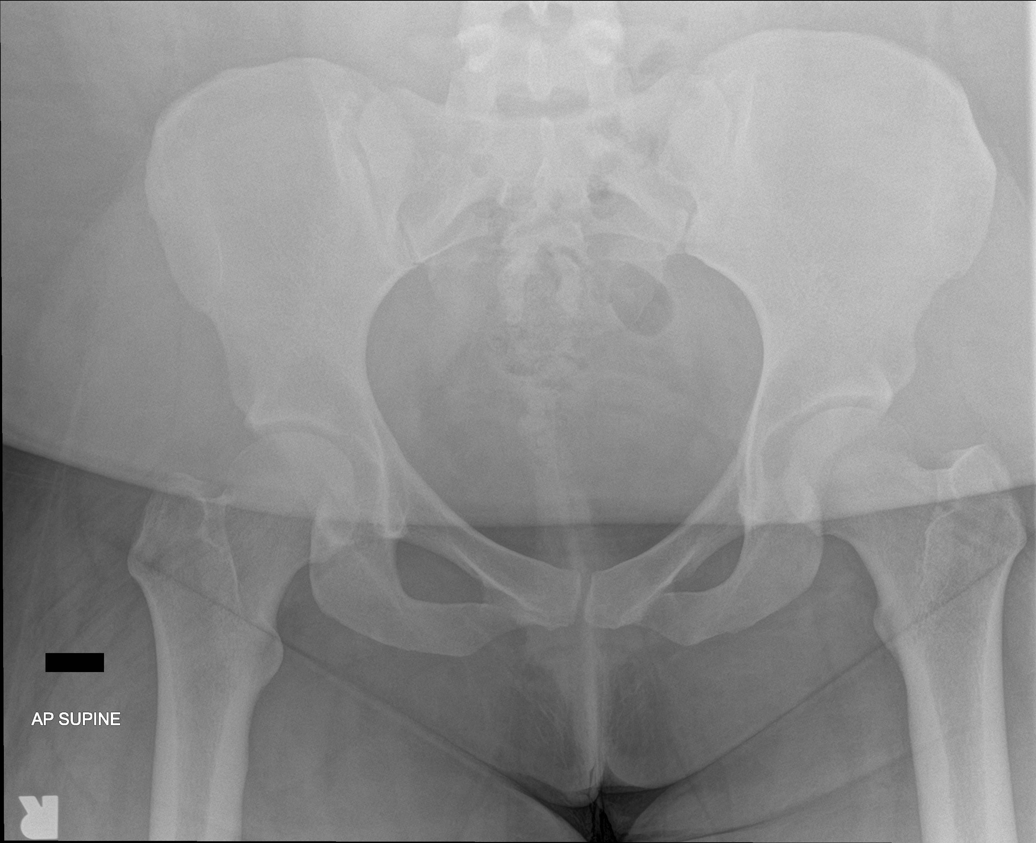

[4 of 4 positions shown; findings below may reference images not displayed]

FINDINGS: There is no evidence of dilated bowel loops or free intraperitoneal
air. No radiopaque calculi or other significant radiographic
abnormality is seen. Heart size and mediastinal contours are within
normal limits. Both lungs are clear.
IMPRESSION: Negative abdominal radiographs.  No acute cardiopulmonary disease.

## 2020-10-16 NOTE — Telephone Encounter (Signed)
Telephone call- No voicemail set up

## 2020-10-19 NOTE — Telephone Encounter (Signed)
Number in chart is not the correct number- will notify patient of blood work at visit and confirm number

## 2020-11-03 ENCOUNTER — Encounter: Payer: Medicaid Other | Admitting: Nurse Practitioner

## 2020-12-01 ENCOUNTER — Encounter: Payer: Self-pay | Admitting: Family Medicine

## 2020-12-01 ENCOUNTER — Encounter: Payer: Medicaid Other | Admitting: Nurse Practitioner

## 2021-01-11 ENCOUNTER — Telehealth: Payer: Self-pay

## 2021-01-11 NOTE — Telephone Encounter (Signed)
Orders put in back in January and pt was notified to do before physical

## 2021-01-11 NOTE — Telephone Encounter (Signed)
Pt is having a Phy on 05/13 will she need blood work done?   Pt call back [phone]

## 2021-02-16 ENCOUNTER — Ambulatory Visit: Payer: Medicaid Other | Admitting: Nurse Practitioner

## 2021-02-19 ENCOUNTER — Encounter: Payer: Self-pay | Admitting: Family Medicine

## 2021-08-15 ENCOUNTER — Telehealth: Payer: Medicaid Other | Admitting: Physician Assistant

## 2021-08-15 DIAGNOSIS — J029 Acute pharyngitis, unspecified: Secondary | ICD-10-CM

## 2021-08-15 MED ORDER — AMOXICILLIN 500 MG PO TABS: 500.0000 mg | ORAL_TABLET | Freq: Two times a day (BID) | ORAL | 0 refills | Status: DC

## 2021-08-15 NOTE — Patient Instructions (Signed)
  [name], thank you for joining Piedad Climes, PA-C for today's virtual visit.  While this provider is not your primary care provider (PCP), if your PCP is located in our provider database this encounter information will be shared with them immediately following your visit.  Consent: (Patient) Madison Gates provided verbal consent for this virtual visit at the beginning of the encounter.  Current Medications:  Current Outpatient Medications:    doxycycline (VIBRAMYCIN) 100 MG capsule, Take 1 capsule PO BID PRN skin Infection, Disp: 60 capsule, Rfl: 0   famotidine (PEPCID) 40 MG tablet, Take 1 tablet (40 mg total) by mouth daily. PRN Acid Reflux, Disp: 30 tablet, Rfl: 2   LO LOESTRIN FE 1 MG-10 MCG / 10 MCG tablet, Take 1 tablet by mouth once daily, Disp: 84 tablet, Rfl: 0   OVER THE COUNTER MEDICATION, One a day multi vit Cranberry Probiotic Apple cider vit, Disp: , Rfl:    phentermine (ADIPEX-P) 37.5 MG tablet, Take 1 tablet (37.5 mg total) by mouth daily before breakfast., Disp: 30 tablet, Rfl: 0   Medications ordered in this encounter:  No orders of the defined types were placed in this encounter.    *If you need refills on other medications prior to your next appointment, please contact your pharmacy*  Follow-Up: Call back or seek an in-person evaluation if the symptoms worsen or if the condition fails to improve as anticipated.  Other Instructions Please keep well-hydrated and get plenty of rest. Alternate tylenol and ibuprofen if needed for throat pain or fever.  Run a humidifier in the bedroom at night. Rest your voice. Salt-water gargles and chloraseptic spray may also be beneficial. Take the antibiotic as directed with food.      If you have been instructed to have an in-person evaluation today at a local Urgent Care facility, please use the link below. It will take you to a list of all of our available [facility], including  address, phone number and hours of operation. Please do not delay care.  [facility]  If you or a family member do not have a primary care provider, use the link below to schedule a visit and establish care. When you choose a [facility] primary care physician or advanced practice provider, you gain a long-term partner in health. Find a Primary Care Provider  Learn more about [facility]'s in-office and virtual care options: [facility] - Get Care Now

## 2021-08-15 NOTE — Progress Notes (Signed)
Virtual Visit Consent   [name], you are scheduled for a virtual visit with a [facility] provider today.     Just as with appointments in the office, your consent must be obtained to participate.  Your consent will be active for this visit and any virtual visit you may have with one of our providers in the next 365 days.     If you have a MyChart account, a copy of this consent can be sent to you electronically.  All virtual visits are billed to your insurance company just like a traditional visit in the office.    As this is a virtual visit, video technology does not allow for your provider to perform a traditional examination.  This may limit your provider's ability to fully assess your condition.  If your provider identifies any concerns that need to be evaluated in person or the need to arrange testing (such as labs, EKG, etc.), we will make arrangements to do so.     Although advances in technology are sophisticated, we cannot ensure that it will always work on either your end or our end.  If the connection with a video visit is poor, the visit may have to be switched to a telephone visit.  With either a video or telephone visit, we are not always able to ensure that we have a secure connection.     I need to obtain your verbal consent now.   Are you willing to proceed with your visit today?    Kayanna A Miyamoto has provided verbal consent on 08/15/2021 for a virtual visit (video or telephone).   Piedad Climes, [place]   Date: 08/15/2021 12:20 PM   Virtual Visit via Video Note   I, Piedad Climes, connected with  SOFIA VANMETER  ([id number], 07/27/1996) on 08/15/21 at 12:15 PM EST by a video-enabled telemedicine application and verified that I am speaking with the correct person using two identifiers.  Location: Patient: Virtual Visit Location Patient: Home Provider: Virtual Visit Location Provider: Home Office   I discussed the limitations of  evaluation and management by telemedicine and the availability of in person appointments. The patient expressed understanding and agreed to proceed.    History of Present Illness: Madison Gates is a 25 y.o. who identifies as a female who was assigned female at birth, and is being seen today for significant sore throat bilaterally with tender and swollen lymph nodes bilaterally with feeling feverish. Denies cough, nasal congestion, aches. Has had known strep exposure.   HPI: HPI  Problems:  Patient Active Problem List   Diagnosis Date Noted   Gastroesophageal reflux disease without esophagitis 02/05/2020   Hydradenitis 02/04/2020   Abnormal uterine bleeding 03/25/2016   Morbid obesity (HCC) 03/25/2016   Acanthosis nigricans 03/25/2016    Allergies:  Allergies  Allergen Reactions   Concerta [Methylphenidate]     Mood swings   Medications:  Current Outpatient Medications:    LO LOESTRIN FE 1 MG-10 MCG / 10 MCG tablet, Take 1 tablet by mouth once daily, Disp: 84 tablet, Rfl: 0  Observations/Objective: Patient is well-developed, well-nourished in no acute distress.  Resting comfortably at home.  Head is normocephalic, atraumatic.  No labored breathing. Speech is clear and coherent with logical content.  Patient is alert and oriented at baseline.   Assessment and Plan: 1. Exudative pharyngitis Rx Amoxicillin BID x 10 days. Supportive measures and OTC medications reviewed in detail. Will need in-person evaluation for any non-resolving,  new or worsening symptoms despite treatment.  Follow Up Instructions: I discussed the assessment and treatment plan with the patient. The patient was provided an opportunity to ask questions and all were answered. The patient agreed with the plan and demonstrated an understanding of the instructions.  A copy of instructions were sent to the patient via MyChart unless otherwise noted below.   The patient was advised to call back or seek an  in-person evaluation if the symptoms worsen or if the condition fails to improve as anticipated.  Time:  I spent 10 minutes with the patient via telehealth technology discussing the above problems/concerns.    Piedad Climes, PA-C

## 2022-05-19 ENCOUNTER — Telehealth: Payer: PRIVATE HEALTH INSURANCE | Admitting: Physician Assistant

## 2022-05-19 ENCOUNTER — Telehealth: Payer: PRIVATE HEALTH INSURANCE | Admitting: Urgent Care

## 2022-05-19 DIAGNOSIS — L03011 Cellulitis of right finger: Secondary | ICD-10-CM

## 2022-05-19 DIAGNOSIS — S99912A Unspecified injury of left ankle, initial encounter: Secondary | ICD-10-CM | POA: Diagnosis not present

## 2022-05-19 MED ORDER — DOXYCYCLINE HYCLATE 100 MG PO TABS: 100.0000 mg | ORAL_TABLET | Freq: Two times a day (BID) | ORAL | 0 refills | Status: DC

## 2022-05-19 MED ORDER — CEPHALEXIN 500 MG PO CAPS: 500.0000 mg | ORAL_CAPSULE | Freq: Four times a day (QID) | ORAL | 0 refills | Status: AC

## 2022-05-19 NOTE — Progress Notes (Signed)
Virtual Visit Consent   Madison Gates, you are scheduled for a virtual visit with a [facility] provider today. Just as with appointments in the office, your consent must be obtained to participate. Your consent will be active for this visit and any virtual visit you may have with one of our providers in the next 365 days. If you have a MyChart account, a copy of this consent can be sent to you electronically.  As this is a virtual visit, video technology does not allow for your provider to perform a traditional examination. This may limit your provider's ability to fully assess your condition. If your provider identifies any concerns that need to be evaluated in person or the need to arrange testing (such as labs, EKG, etc.), we will make arrangements to do so. Although advances in technology are sophisticated, we cannot ensure that it will always work on either your end or our end. If the connection with a video visit is poor, the visit may have to be switched to a telephone visit. With either a video or telephone visit, we are not always able to ensure that we have a secure connection.  By engaging in this virtual visit, you consent to the provision of healthcare and authorize for your insurance to be billed (if applicable) for the services provided during this visit. Depending on your insurance coverage, you may receive a charge related to this service.  I need to obtain your verbal consent now. Are you willing to proceed with your visit today? Madison Gates has provided verbal consent on 05/19/2022 for a virtual visit (video or telephone). Jarold Motto, [place]  Date: 05/19/2022 9:44 AM  Virtual Visit via Video Note   I, Jarold Motto, connected with  Madison Gates  ([id number], 12-06-1995) on 05/19/22 at  9:30 AM EDT by a video-enabled telemedicine application and verified that I am speaking with the correct person using two identifiers.  Location: Patient: Virtual Visit  Location Patient: Home Provider: Virtual Visit Location Provider: Home Office   I discussed the limitations of evaluation and management by telemedicine and the availability of in person appointments. The patient expressed understanding and agreed to proceed.    History of Present Illness: Madison Gates is a 26 y.o. who identifies as a female who was assigned female at birth, and is being seen today for two issues below.  R middle finger pain/swelling 3 days ago this started Not getting worse Soaking in salt water She has swelling at the bottom of her cuticle with a little bit of white under her skin -- she is concerned that it may be infected Denies: fevers, chills, pain radiating up hand/wrist   Left ankle Walking down steps about 5 days ago she twisted her left ankle Has used Ace bandage, ice, NSAIDs Symptoms are improving with time Denies: worsening sx, pain with bearing weight  HPI: HPI  Problems:  Patient Active Problem List   Diagnosis Date Noted   Gastroesophageal reflux disease without esophagitis 02/05/2020   Hydradenitis 02/04/2020   Abnormal uterine bleeding 03/25/2016   Morbid obesity (HCC) 03/25/2016   Acanthosis nigricans 03/25/2016    Allergies:  Allergies  Allergen Reactions   Concerta [Methylphenidate]     Mood swings   Medications:  Current Outpatient Medications:    doxycycline (VIBRA-TABS) 100 MG tablet, Take 1 tablet (100 mg total) by mouth 2 (two) times daily., Disp: 20 tablet, Rfl: 0   LO LOESTRIN FE 1 MG-10 MCG / 10 MCG  tablet, Take 1 tablet by mouth once daily, Disp: 84 tablet, Rfl: 0  Observations/Objective: Patient is well-developed, well-nourished in no acute distress.  Resting comfortably  at home.  Head is normocephalic, atraumatic.  No labored breathing.  Speech is clear and coherent with logical content.  Patient is alert and oriented at baseline.   Assessment and Plan: 1. Injury of left ankle, initial encounter No red  flags Continue RICE therapy Do not feel xray is warranted at this time Continue to monitor and follow-up with PCP if needed   2. Paronychia of finger of right hand No red flags Start doxycyline (she denies concerns for pregnancy) NSAIDS for pain/inflammation Continue to monitor and follow-up with PCP if needed  Follow Up Instructions: I discussed the assessment and treatment plan with the patient. The patient was provided an opportunity to ask questions and all were answered. The patient agreed with the plan and demonstrated an understanding of the instructions.  A copy of instructions were sent to the patient via MyChart unless otherwise noted below.    The patient was advised to call back or seek an in-person evaluation if the symptoms worsen or if the condition fails to improve as anticipated.  Time:  I spent 5-10 minutes with the patient via telehealth technology discussing the above problems/concerns.    Jarold Motto, [place]

## 2022-05-19 NOTE — Patient Instructions (Signed)
  Wade A Lunden, thank you for joining Maretta Bees, PA for today's virtual visit.  While this provider is not your primary care provider (PCP), if your PCP is located in our provider database this encounter information will be shared with them immediately following your visit.  Consent: (Patient) Madison Gates provided verbal consent for this virtual visit at the beginning of the encounter.  Current Medications:  Current Outpatient Medications:    cephALEXin (KEFLEX) 500 MG capsule, Take 1 capsule (500 mg total) by mouth 4 (four) times daily for 7 days., Disp: 28 capsule, Rfl: 0   LO LOESTRIN FE 1 MG-10 MCG / 10 MCG tablet, Take 1 tablet by mouth once daily, Disp: 84 tablet, Rfl: 0   Medications ordered in this encounter:  Meds ordered this encounter  Medications   cephALEXin (KEFLEX) 500 MG capsule    Sig: Take 1 capsule (500 mg total) by mouth 4 (four) times daily for 7 days.    Dispense:  28 capsule    Refill:  0    Order Specific Question:   Supervising Provider    Answer:   Merrilee Jansky [[id number]]     *If you need refills on other medications prior to your next appointment, please contact your pharmacy*  Follow-Up: Call back or seek an in-person evaluation if the symptoms worsen or if the condition fails to improve as anticipated.  Other Instructions Start taking cephalexin four times daily. Continue epsom salt soaks. Do not fill the doxycycline. May also use topical bacitracin after each soak.  Keep open. Do not over-occlude with a Band-Aid. Follow up with your PCP or in a face to face urgent care if symptoms do not resolve.   If you have been instructed to have an in-person evaluation today at a local Urgent Care facility, please use the link below. It will take you to a list of all of our available [facility], including address, phone number and hours of operation. Please do not delay care.  [facility]  If you or a  family member do not have a primary care provider, use the link below to schedule a visit and establish care. When you choose a [facility] primary care physician or advanced practice provider, you gain a long-term partner in health. Find a Primary Care Provider  Learn more about [facility] in-office and virtual care options: [facility] - Get Care Now

## 2022-05-19 NOTE — Progress Notes (Signed)
Virtual Visit Consent   Madison Gates, you are scheduled for a virtual visit with a [facility] provider today. Just as with appointments in the office, your consent must be obtained to participate. Your consent will be active for this visit and any virtual visit you may have with one of our providers in the next 365 days. If you have a MyChart account, a copy of this consent can be sent to you electronically.  As this is a virtual visit, video technology does not allow for your provider to perform a traditional examination. This may limit your provider's ability to fully assess your condition. If your provider identifies any concerns that need to be evaluated in person or the need to arrange testing (such as labs, EKG, etc.), we will make arrangements to do so. Although advances in technology are sophisticated, we cannot ensure that it will always work on either your end or our end. If the connection with a video visit is poor, the visit may have to be switched to a telephone visit. With either a video or telephone visit, we are not always able to ensure that we have a secure connection.  By engaging in this virtual visit, you consent to the provision of healthcare and authorize for your insurance to be billed (if applicable) for the services provided during this visit. Depending on your insurance coverage, you may receive a charge related to this service.  I need to obtain your verbal consent now. Are you willing to proceed with your visit today? Madison Gates has provided verbal consent on 05/19/2022 for a virtual visit (video or telephone). Madison Bees, PA  Date: 05/19/2022 6:40 PM  Virtual Visit via Video Note   I, Madison Gates, connected with  Madison Gates  ([id number], 08-05-1996) on 05/19/22 at  6:30 PM EDT by a video-enabled telemedicine application and verified that I am speaking with the correct person using two identifiers.  Location: Patient: Virtual Visit  Location Patient: Home Provider: Virtual Visit Location Provider: Home Office   I discussed the limitations of evaluation and management by telemedicine and the availability of in person appointments. The patient expressed understanding and agreed to proceed.    History of Present Illness: Madison Gates is a 26 y.o. female who is being seen today for medication change.  HPI: Pt was seen via video visit earlier this morning and was prescribed doxycycline for her paronychia. Pt does not currently have insurance and is unable to afford it as it was >$70. She is requesting an alternative antibiotic that is more affordable.    Problems:  Patient Active Problem List   Diagnosis Date Noted   Gastroesophageal reflux disease without esophagitis 02/05/2020   Hydradenitis 02/04/2020   Abnormal uterine bleeding 03/25/2016   Morbid obesity (HCC) 03/25/2016   Acanthosis nigricans 03/25/2016    Allergies:  Allergies  Allergen Reactions   Concerta [Methylphenidate]     Mood swings   Medications:  Current Outpatient Medications:    cephALEXin (KEFLEX) 500 MG capsule, Take 1 capsule (500 mg total) by mouth 4 (four) times daily for 7 days., Disp: 28 capsule, Rfl: 0   LO LOESTRIN FE 1 MG-10 MCG / 10 MCG tablet, Take 1 tablet by mouth once daily, Disp: 84 tablet, Rfl: 0  Observations/Objective: Patient is well-developed, well-nourished in no acute distress.  Resting comfortably at home.  Head is normocephalic, atraumatic.  No labored breathing.  Speech is clear and coherent with logical content.  Patient  is alert and oriented at baseline.  Distal tip of R middle finger at base of nail bed raised, red, swollen and tender. No abscess, no drainage.  Assessment and Plan: 1. Paronychia of finger of right hand  Will stop doxycycline as pt could not afford. Will change to cephalexin QID x 7 days. Continue epsom salt soaks.  Follow Up Instructions: I discussed the assessment and treatment  plan with the patient. The patient was provided an opportunity to ask questions and all were answered. The patient agreed with the plan and demonstrated an understanding of the instructions.  A copy of instructions were sent to the patient via MyChart unless otherwise noted below.   The patient was advised to call back or seek an in-person evaluation if the symptoms worsen or if the condition fails to improve as anticipated.  Time:  I spent 6 minutes with the patient via telehealth technology discussing the above problems/concerns.    Keary Hanak L Shenique Childers, PA

## 2022-05-19 NOTE — Patient Instructions (Signed)
It was great to see you!  Start doxycycline antibiotic for your finger -- I've sent this to [facility] at [facility] [address]; [phone]  Keep doing what you are doing for your ankle  Follow-up with PCP or urgent care if you feel like you are not improving like you expect  Take care,  Jarold Motto PA-C

## 2022-10-04 ENCOUNTER — Telehealth: Payer: PRIVATE HEALTH INSURANCE | Admitting: Physician Assistant

## 2022-10-04 ENCOUNTER — Telehealth: Payer: BC Managed Care – PPO | Admitting: Physician Assistant

## 2022-10-04 DIAGNOSIS — B9789 Other viral agents as the cause of diseases classified elsewhere: Secondary | ICD-10-CM

## 2022-10-04 DIAGNOSIS — B9689 Other specified bacterial agents as the cause of diseases classified elsewhere: Secondary | ICD-10-CM

## 2022-10-04 DIAGNOSIS — J019 Acute sinusitis, unspecified: Secondary | ICD-10-CM | POA: Diagnosis not present

## 2022-10-04 MED ORDER — AMOXICILLIN-POT CLAVULANATE 875-125 MG PO TABS: 1.0000 | ORAL_TABLET | Freq: Two times a day (BID) | ORAL | 0 refills | Status: DC

## 2022-10-04 MED ORDER — PROMETHAZINE-DM 6.25-15 MG/5ML PO SYRP: 5.0000 mL | ORAL_SOLUTION | Freq: Four times a day (QID) | ORAL | 0 refills | Status: DC | PRN

## 2022-10-04 MED ORDER — FLUTICASONE PROPIONATE 50 MCG/ACT NA SUSP: 2.0000 | Freq: Every day | NASAL | 0 refills | Status: DC

## 2022-10-04 NOTE — Progress Notes (Signed)
Virtual Visit Consent   Madison Gates, you are scheduled for a virtual visit with a [facility] provider today. Just as with appointments in the office, your consent must be obtained to participate. Your consent will be active for this visit and any virtual visit you may have with one of our providers in the next 365 days. If you have a MyChart account, a copy of this consent can be sent to you electronically.  As this is a virtual visit, video technology does not allow for your provider to perform a traditional examination. This may limit your provider's ability to fully assess your condition. If your provider identifies any concerns that need to be evaluated in person or the need to arrange testing (such as labs, EKG, etc.), we will make arrangements to do so. Although advances in technology are sophisticated, we cannot ensure that it will always work on either your end or our end. If the connection with a video visit is poor, the visit may have to be switched to a telephone visit. With either a video or telephone visit, we are not always able to ensure that we have a secure connection.  By engaging in this virtual visit, you consent to the provision of healthcare and authorize for your insurance to be billed (if applicable) for the services provided during this visit. Depending on your insurance coverage, you may receive a charge related to this service.  I need to obtain your verbal consent now. Are you willing to proceed with your visit today? Madison Gates has provided verbal consent on 10/04/2022 for a virtual visit (video or telephone). Madison Loveless, PA-C  Date: 10/04/2022 8:13 AM  Virtual Visit via Video Note   I, Madison Gates, connected with  Madison Gates  ([id number], June 20, 1996) on 10/04/22 at  8:00 AM EST by a video-enabled telemedicine application and verified that I am speaking with the correct person using two identifiers.  Location: Patient:  Virtual Visit Location Patient: Home Provider: Virtual Visit Location Provider: Home Office   I discussed the limitations of evaluation and management by telemedicine and the availability of in person appointments. The patient expressed understanding and agreed to proceed.    History of Present Illness: Madison Gates is a 26 y.o. who identifies as a female who was assigned female at birth, and is being seen today for possible sinus infection.  HPI: Sinusitis This is a new problem. The current episode started in the past 7 days. The problem has been gradually worsening since onset. There has been no fever. Associated symptoms include congestion, coughing, headaches, sinus pressure and a sore throat (improving). Pertinent negatives include no chills, ear pain, hoarse voice or shortness of breath. Treatments tried: cough drops, mucinex all in one, alka seltzer. The treatment provided no relief.     Problems:  Patient Active Problem List   Diagnosis Date Noted   Gastroesophageal reflux disease without esophagitis 02/05/2020   Hydradenitis 02/04/2020   Abnormal uterine bleeding 03/25/2016   Morbid obesity (HCC) 03/25/2016   Acanthosis nigricans 03/25/2016    Allergies:  Allergies  Allergen Reactions   Concerta [Methylphenidate]     Mood swings   Medications:  Current Outpatient Medications:    amoxicillin-clavulanate (AUGMENTIN) 875-125 MG tablet, Take 1 tablet by mouth 2 (two) times daily., Disp: 14 tablet, Rfl: 0   promethazine-dextromethorphan (PROMETHAZINE-DM) 6.25-15 MG/5ML syrup, Take 5 mLs by mouth 4 (four) times daily as needed., Disp: 118 mL, Rfl: 0   fluticasone (  FLONASE) 50 MCG/ACT nasal spray, Place 2 sprays into both nostrils daily., Disp: 16 g, Rfl: 0   LO LOESTRIN FE 1 MG-10 MCG / 10 MCG tablet, Take 1 tablet by mouth once daily, Disp: 84 tablet, Rfl: 0  Observations/Objective: Patient is well-developed, well-nourished in no acute distress.  Resting comfortably  at home.  Head is normocephalic, atraumatic.  No labored breathing.  Speech is clear and coherent with logical content.  Patient is alert and oriented at baseline.    Assessment and Plan: 1. Acute bacterial sinusitis - amoxicillin-clavulanate (AUGMENTIN) 875-125 MG tablet; Take 1 tablet by mouth 2 (two) times daily.  Dispense: 14 tablet; Refill: 0 - promethazine-dextromethorphan (PROMETHAZINE-DM) 6.25-15 MG/5ML syrup; Take 5 mLs by mouth 4 (four) times daily as needed.  Dispense: 118 mL; Refill: 0  - Worsening symptoms that have not responded to OTC medications.  - Will give Augmentin - Promethazine DM for cough - Continue allergy medications.  - Steam and humidifier can help - Stay well hydrated and get plenty of rest.  - Seek in person evaluation if no symptom improvement or if symptoms worsen   Follow Up Instructions: I discussed the assessment and treatment plan with the patient. The patient was provided an opportunity to ask questions and all were answered. The patient agreed with the plan and demonstrated an understanding of the instructions.  A copy of instructions were sent to the patient via MyChart unless otherwise noted below.    The patient was advised to call back or seek an in-person evaluation if the symptoms worsen or if the condition fails to improve as anticipated.  Time:  I spent 10 minutes with the patient via telehealth technology discussing the above problems/concerns.    Madison Loveless, PA-C

## 2022-10-04 NOTE — Progress Notes (Signed)

## 2022-10-04 NOTE — Progress Notes (Signed)
I have spent 5 minutes in review of e-visit questionnaire, review and updating patient chart, medical decision making and response to patient.   Jay Haskew [facility] Loy Little, PA-C    

## 2022-10-04 NOTE — Patient Instructions (Signed)
[name], thank you for joining Margaretann Loveless, PA-C for today's virtual visit.  While this provider is not your primary care provider (PCP), if your PCP is located in our provider database this encounter information will be shared with them immediately following your visit.   A [facility] MyChart account gives you access to today's visit and all your visits, tests, and labs performed at [facility] " click here if you don't have a [facility] MyChart account or go to [email or web address]  Consent: (Patient) Madison Gates provided verbal consent for this virtual visit at the beginning of the encounter.  Current Medications:  Current Outpatient Medications:    amoxicillin-clavulanate (AUGMENTIN) 875-125 MG tablet, Take 1 tablet by mouth 2 (two) times daily., Disp: 14 tablet, Rfl: 0   promethazine-dextromethorphan (PROMETHAZINE-DM) 6.25-15 MG/5ML syrup, Take 5 mLs by mouth 4 (four) times daily as needed., Disp: 118 mL, Rfl: 0   fluticasone (FLONASE) 50 MCG/ACT nasal spray, Place 2 sprays into both nostrils daily., Disp: 16 g, Rfl: 0   LO LOESTRIN FE 1 MG-10 MCG / 10 MCG tablet, Take 1 tablet by mouth once daily, Disp: 84 tablet, Rfl: 0   Medications ordered in this encounter:  Meds ordered this encounter  Medications   amoxicillin-clavulanate (AUGMENTIN) 875-125 MG tablet    Sig: Take 1 tablet by mouth 2 (two) times daily.    Dispense:  14 tablet    Refill:  0    Order Specific Question:   Supervising Provider    Answer:   Merrilee Jansky [id number]   promethazine-dextromethorphan (PROMETHAZINE-DM) 6.25-15 MG/5ML syrup    Sig: Take 5 mLs by mouth 4 (four) times daily as needed.    Dispense:  118 mL    Refill:  0    Order Specific Question:   Supervising Provider    Answer:   Merrilee Jansky [[id number]]     *If you need refills on other medications prior to your next appointment, please contact your pharmacy*  Follow-Up: Call back or seek  an in-person evaluation if the symptoms worsen or if the condition fails to improve as anticipated.  [facility] [phone]  Other Instructions  Sinus Infection, Adult A sinus infection, also called sinusitis, is inflammation of your sinuses. Sinuses are hollow spaces in the bones around your face. Your sinuses are located: Around your eyes. In the middle of your forehead. Behind your nose. In your cheekbones. Mucus normally drains out of your sinuses. When your nasal tissues become inflamed or swollen, mucus can become trapped or blocked. This allows bacteria, viruses, and fungi to grow, which leads to infection. Most infections of the sinuses are caused by a virus. A sinus infection can develop quickly. It can last for up to 4 weeks (acute) or for more than 12 weeks (chronic). A sinus infection often develops after a cold. What are the causes? This condition is caused by anything that creates swelling in the sinuses or stops mucus from draining. This includes: Allergies. Asthma. Infection from bacteria or viruses. Deformities or blockages in your nose or sinuses. Abnormal growths in the nose (nasal polyps). Pollutants, such as chemicals or irritants in the air. Infection from fungi. This is rare. What increases the risk? You are more likely to develop this condition if you: Have a weak body defense system (immune system). Do a lot of swimming or diving. Overuse nasal sprays. Smoke. What are the signs or symptoms? The main symptoms of this condition are  pain and a feeling of pressure around the affected sinuses. Other symptoms include: Stuffy nose or congestion that makes it difficult to breathe through your nose. Thick yellow or greenish drainage from your nose. Tenderness, swelling, and warmth over the affected sinuses. A cough that may get worse at night. Decreased sense of smell and taste. Extra mucus that collects in the throat or the back of the nose  (postnasal drip) causing a sore throat or bad breath. Tiredness (fatigue). Fever. How is this diagnosed? This condition is diagnosed based on: Your symptoms. Your medical history. A physical exam. Tests to find out if your condition is acute or chronic. This may include: Checking your nose for nasal polyps. Viewing your sinuses using a device that has a light (endoscope). Testing for allergies or bacteria. Imaging tests, such as an MRI or CT scan. In rare cases, a bone biopsy may be done to rule out more serious types of fungal sinus disease. How is this treated? Treatment for a sinus infection depends on the cause and whether your condition is chronic or acute. If caused by a virus, your symptoms should go away on their own within 10 days. You may be given medicines to relieve symptoms. They include: Medicines that shrink swollen nasal passages (decongestants). A spray that eases inflammation of the nostrils (topical intranasal corticosteroids). Rinses that help get rid of thick mucus in your nose (nasal saline washes). Medicines that treat allergies (antihistamines). Over-the-counter pain relievers. If caused by bacteria, your health care provider may recommend waiting to see if your symptoms improve. Most bacterial infections will get better without antibiotic medicine. You may be given antibiotics if you have: A severe infection. A weak immune system. If caused by narrow nasal passages or nasal polyps, surgery may be needed. Follow these instructions at home: Medicines Take, use, or apply over-the-counter and prescription medicines only as told by your health care provider. These may include nasal sprays. If you were prescribed an antibiotic medicine, take it as told by your health care provider. Do not stop taking the antibiotic even if you start to feel better. Hydrate and humidify  Drink enough fluid to keep your urine pale yellow. Staying hydrated will help to thin your  mucus. Use a cool mist humidifier to keep the humidity level in your home above 50%. Inhale steam for 10-15 minutes, 3-4 times a day, or as told by your health care provider. You can do this in the bathroom while a hot shower is running. Limit your exposure to cool or dry air. Rest Rest as much as possible. Sleep with your head raised (elevated). Make sure you get enough sleep each night. General instructions  Apply a warm, moist washcloth to your face 3-4 times a day or as told by your health care provider. This will help with discomfort. Use nasal saline washes as often as told by your health care provider. Wash your hands often with soap and water to reduce your exposure to germs. If soap and water are not available, use hand sanitizer. Do not smoke. Avoid being around people who are smoking (secondhand smoke). Keep all follow-up visits. This is important. Contact a health care provider if: You have a fever. Your symptoms get worse. Your symptoms do not improve within 10 days. Get help right away if: You have a severe headache. You have persistent vomiting. You have severe pain or swelling around your face or eyes. You have vision problems. You develop confusion. Your neck is stiff. You have  trouble breathing. These symptoms may be an emergency. Get help right away. Call 911. Do not wait to see if the symptoms will go away. Do not drive yourself to the hospital. Summary A sinus infection is soreness and inflammation of your sinuses. Sinuses are hollow spaces in the bones around your face. This condition is caused by nasal tissues that become inflamed or swollen. The swelling traps or blocks the flow of mucus. This allows bacteria, viruses, and fungi to grow, which leads to infection. If you were prescribed an antibiotic medicine, take it as told by your health care provider. Do not stop taking the antibiotic even if you start to feel better. Keep all follow-up visits. This is  important. This information is not intended to replace advice given to you by your health care provider. Make sure you discuss any questions you have with your health care provider. Document Revised: 08/28/2021 Document Reviewed: 08/28/2021 Elsevier Patient Education  2023 Elsevier Inc.    If you have been instructed to have an in-person evaluation today at a local Urgent Care facility, please use the link below. It will take you to a list of all of our available [facility], including address, phone number and hours of operation. Please do not delay care.  [facility]  If you or a family member do not have a primary care provider, use the link below to schedule a visit and establish care. When you choose a [facility] primary care physician or advanced practice provider, you gain a long-term partner in health. Find a Primary Care Provider  Learn more about [facility]'s in-office and virtual care options: [facility] - Get Care Now

## 2022-11-07 ENCOUNTER — Telehealth: Payer: BC Managed Care – PPO | Admitting: Physician Assistant

## 2022-11-07 DIAGNOSIS — B9789 Other viral agents as the cause of diseases classified elsewhere: Secondary | ICD-10-CM

## 2022-11-07 DIAGNOSIS — J019 Acute sinusitis, unspecified: Secondary | ICD-10-CM | POA: Diagnosis not present

## 2022-11-07 MED ORDER — PREDNISONE 20 MG PO TABS: 40.0000 mg | ORAL_TABLET | Freq: Every day | ORAL | 0 refills | Status: DC

## 2022-11-07 NOTE — Progress Notes (Signed)
Virtual Visit Consent   Marigene Ehlers, you are scheduled for a virtual visit with a [facility] provider today. Just as with appointments in the office, your consent must be obtained to participate. Your consent will be active for this visit and any virtual visit you may have with one of our providers in the next 365 days. If you have a MyChart account, a copy of this consent can be sent to you electronically.  As this is a virtual visit, video technology does not allow for your provider to perform a traditional examination. This may limit your provider's ability to fully assess your condition. If your provider identifies any concerns that need to be evaluated in person or the need to arrange testing (such as labs, EKG, etc.), we will make arrangements to do so. Although advances in technology are sophisticated, we cannot ensure that it will always work on either your end or our end. If the connection with a video visit is poor, the visit may have to be switched to a telephone visit. With either a video or telephone visit, we are not always able to ensure that we have a secure connection.  By engaging in this virtual visit, you consent to the provision of healthcare and authorize for your insurance to be billed (if applicable) for the services provided during this visit. Depending on your insurance coverage, you may receive a charge related to this service.  I need to obtain your verbal consent now. Are you willing to proceed with your visit today? Madison Gates has provided verbal consent on 11/07/2022 for a virtual visit (video or telephone). Leeanne Rio, [place]  Date: 11/07/2022 8:44 AM  Virtual Visit via Video Note   I, Leeanne Rio, connected with  Madison Gates  ([id number], 01/05/1996) on 11/07/22 at  8:15 AM EST by a video-enabled telemedicine application and verified that I am speaking with the correct person using two identifiers.  Location: Patient:  Virtual Visit Location Patient: Home Provider: Virtual Visit Location Provider: Home Office   I discussed the limitations of evaluation and management by telemedicine and the availability of in person appointments. The patient expressed understanding and agreed to proceed.    History of Present Illness: Madison Gates is a 27 y.o. who identifies as a female who was assigned female at birth, and is being seen today for few days of nasal congestion, sinus pressure and drainage. Some mild cough with this. Denies fever, chills, aches. No noted chest congestion. Was treated for sinus infection a month ago with resolution. She is prone to sinus infections and wanting to get ahead of things before they worsen. No known sick contact. No COVID exposure.   HPI: HPI  Problems:  Patient Active Problem List   Diagnosis Date Noted   Gastroesophageal reflux disease without esophagitis 02/05/2020   Hydradenitis 02/04/2020   Abnormal uterine bleeding 03/25/2016   Morbid obesity ([place]) 03/25/2016   Acanthosis nigricans 03/25/2016    Allergies:  Allergies  Allergen Reactions   Concerta [Methylphenidate]     Mood swings   Medications:  Current Outpatient Medications:    predniSONE (DELTASONE) 20 MG tablet, Take 2 tablets (40 mg total) by mouth daily with breakfast., Disp: 10 tablet, Rfl: 0   fluticasone (FLONASE) 50 MCG/ACT nasal spray, Place 2 sprays into both nostrils daily., Disp: 16 g, Rfl: 0  Observations/Objective: Patient is well-developed, well-nourished in no acute distress.  Resting comfortably at home.  Head is normocephalic, atraumatic.  No  labored breathing. Speech is clear and coherent with logical content.  Patient is alert and oriented at baseline.   Assessment and Plan: 1. Acute viral sinusitis - predniSONE (DELTASONE) 20 MG tablet; Take 2 tablets (40 mg total) by mouth daily with breakfast.  Dispense: 10 tablet; Refill: 0  Suspect mild viral sinusitis. Supportive measures  and OTC medications reviewed. She is to restart Flonase. Ok to continue OTC Alka-Seltzer sinus. Will give short course of prednisone to help reduce inflammation and expedite recovery to avoid secondary bacterial sinusitis that she is prone to getting.   Follow Up Instructions: I discussed the assessment and treatment plan with the patient. The patient was provided an opportunity to ask questions and all were answered. The patient agreed with the plan and demonstrated an understanding of the instructions.  A copy of instructions were sent to the patient via MyChart unless otherwise noted below.   The patient was advised to call back or seek an in-person evaluation if the symptoms worsen or if the condition fails to improve as anticipated.  Time:  I spent 10 minutes with the patient via telehealth technology discussing the above problems/concerns.    Leeanne Rio, PA-C

## 2022-11-07 NOTE — Patient Instructions (Signed)
  Marigene Ehlers, thank you for joining Leeanne Rio, PA-C for today's virtual visit.  While this provider is not your primary care provider (PCP), if your PCP is located in our provider database this encounter information will be shared with them immediately following your visit.   [facility] account gives you access to today's visit and all your visits, tests, and labs performed at [facility] " click here if you don't have a [facility] account or go to [email or web address]  Consent: (Patient) Madison Gates provided verbal consent for this virtual visit at the beginning of the encounter.  Current Medications:  Current Outpatient Medications:    amoxicillin-clavulanate (AUGMENTIN) 875-125 MG tablet, Take 1 tablet by mouth 2 (two) times daily., Disp: 14 tablet, Rfl: 0   fluticasone (FLONASE) 50 MCG/ACT nasal spray, Place 2 sprays into both nostrils daily., Disp: 16 g, Rfl: 0   LO LOESTRIN FE 1 MG-10 MCG / 10 MCG tablet, Take 1 tablet by mouth once daily, Disp: 84 tablet, Rfl: 0   promethazine-dextromethorphan (PROMETHAZINE-DM) 6.25-15 MG/5ML syrup, Take 5 mLs by mouth 4 (four) times daily as needed., Disp: 118 mL, Rfl: 0   Medications ordered in this encounter:  No orders of the defined types were placed in this encounter.    *If you need refills on other medications prior to your next appointment, please contact your pharmacy*  Follow-Up: Call back or seek an in-person evaluation if the symptoms worsen or if the condition fails to improve as anticipated.  [facility] [phone]  Other Instructions Please hydrate and rest. Restart the Flonase. Ok to continue your OTC medications. Take the prednisone as directed. If not resolving or any new/worsening symptoms despite treatment, please let us know ASAP.    If you have been instructed to have an in-person evaluation today at a local Urgent Care facility, please  use the link below. It will take you to a list of all of our available [facility], including address, phone number and hours of operation. Please do not delay care.  [facility]  If you or a family member do not have a primary care provider, use the link below to schedule a visit and establish care. When you choose a [facility] primary care physician or advanced practice provider, you gain a long-term partner in health. Find a Primary Care Provider  Learn more about 's in-office and virtual care options: [facility] Now

## 2022-12-18 ENCOUNTER — Telehealth: Payer: BC Managed Care – PPO | Admitting: Physician Assistant

## 2022-12-18 NOTE — Progress Notes (Signed)
The patient no-showed for appointment despite this provider sending direct link x 2 with no response and waiting for at least 10 minutes from appointment time for patient to join. They will be marked as a NS for this appointment/time.   Ronald Vinsant M Tamim Skog, PA-C    

## 2023-05-06 ENCOUNTER — Telehealth: Payer: BC Managed Care – PPO | Admitting: Nurse Practitioner

## 2023-05-06 DIAGNOSIS — J020 Streptococcal pharyngitis: Secondary | ICD-10-CM

## 2023-05-07 MED ORDER — AMOXICILLIN 500 MG PO CAPS: 500.0000 mg | ORAL_CAPSULE | Freq: Two times a day (BID) | ORAL | 0 refills | Status: AC

## 2023-05-07 NOTE — Progress Notes (Signed)
E-Visit for Sore Throat - Strep Symptoms  We are sorry that you are not feeling well.  Here is how we plan to help!  Based on what you have shared with me it is likely that you have strep pharyngitis.  Strep pharyngitis is inflammation and infection in the back of the throat.  This is an infection cause by bacteria and is treated with antibiotics.  I have prescribed Amoxicillin 500 mg twice a day for 10 days. For throat pain, we recommend over the counter oral pain relief medications such as acetaminophen or aspirin, or anti-inflammatory medications such as ibuprofen or naproxen sodium. Topical treatments such as oral throat lozenges or sprays may be used as needed. Strep infections are not as easily transmitted as other respiratory infections, however we still recommend that you avoid close contact with loved ones, especially the very young and elderly.  Remember to wash your hands thoroughly throughout the day as this is the number one way to prevent the spread of infection and wipe down door knobs and counters with disinfectant.   Home Care: Only take medications as instructed by your medical team. Complete the entire course of an antibiotic. Do not take these medications with alcohol. A steam or ultrasonic humidifier can help congestion.  You can place a towel over your head and breathe in the steam from hot water coming from a faucet. Avoid close contacts especially the very young and the elderly. Cover your mouth when you cough or sneeze. Always remember to wash your hands.  Get Help Right Away If: You develop worsening fever or sinus pain. You develop a severe head ache or visual changes. Your symptoms persist after you have completed your treatment plan.  Make sure you Understand these instructions. Will watch your condition. Will get help right away if you are not doing well or get worse.   Thank you for choosing an e-visit.  Your e-visit answers were reviewed by a board  certified advanced clinical practitioner to complete your personal care plan. Depending upon the condition, your plan could have included both over the counter or prescription medications.  Please review your pharmacy choice. Make sure the pharmacy is open so you can pick up prescription now. If there is a problem, you may contact your provider through MyChart messaging and have the prescription routed to another pharmacy.  Your safety is important to us. If you have drug allergies check your prescription carefully.   For the next 24 hours you can use MyChart to ask questions about today's visit, request a non-urgent call back, or ask for a work or school excuse. You will get an email in the next two days asking about your experience. I hope that your e-visit has been valuable and will speed your recovery.   Meds ordered this encounter  Medications   amoxicillin (AMOXIL) 500 MG capsule    Sig: Take 1 capsule (500 mg total) by mouth 2 (two) times daily for 10 days.    Dispense:  20 capsule    Refill:  0     I spent approximately 5 minutes reviewing the patient's history, current symptoms and coordinating their care today.   

## 2023-06-11 ENCOUNTER — Telehealth: Payer: BC Managed Care – PPO | Admitting: Physician Assistant

## 2023-06-11 DIAGNOSIS — B3731 Acute candidiasis of vulva and vagina: Secondary | ICD-10-CM | POA: Diagnosis not present

## 2023-06-12 MED ORDER — FLUCONAZOLE 150 MG PO TABS: 150.0000 mg | ORAL_TABLET | Freq: Once | ORAL | 0 refills | Status: AC

## 2023-06-12 NOTE — Progress Notes (Signed)
I have spent 5 minutes in review of e-visit questionnaire, review and updating patient chart, medical decision making and response to patient.   William Cody Martin, PA-C    

## 2023-06-12 NOTE — Progress Notes (Signed)

## 2023-06-12 NOTE — Progress Notes (Signed)
Message sent to patient requesting further input regarding current symptoms. Awaiting patient response.  

## 2023-06-14 ENCOUNTER — Ambulatory Visit: Payer: BC Managed Care – PPO

## 2023-07-14 ENCOUNTER — Ambulatory Visit: Payer: BC Managed Care – PPO | Admitting: Family Medicine

## 2023-07-22 ENCOUNTER — Encounter: Payer: Self-pay | Admitting: Family Medicine

## 2023-12-08 ENCOUNTER — Telehealth: Admitting: Physician Assistant

## 2023-12-08 DIAGNOSIS — B9789 Other viral agents as the cause of diseases classified elsewhere: Secondary | ICD-10-CM | POA: Diagnosis not present

## 2023-12-08 DIAGNOSIS — J019 Acute sinusitis, unspecified: Secondary | ICD-10-CM

## 2023-12-09 ENCOUNTER — Telehealth: Admitting: Family Medicine

## 2023-12-09 DIAGNOSIS — J069 Acute upper respiratory infection, unspecified: Secondary | ICD-10-CM | POA: Diagnosis not present

## 2023-12-09 MED ORDER — PREDNISONE 20 MG PO TABS: 20.0000 mg | ORAL_TABLET | Freq: Two times a day (BID) | ORAL | 0 refills | Status: AC

## 2023-12-09 MED ORDER — AZELASTINE HCL 0.1 % NA SOLN: 1.0000 | Freq: Two times a day (BID) | NASAL | 0 refills | Status: DC

## 2023-12-09 NOTE — Progress Notes (Signed)
 Virtual Visit Consent   [name], you are scheduled for a virtual visit with a [facility] provider today. Just as with appointments in the office, your consent must be obtained to participate. Your consent will be active for this visit and any virtual visit you may have with one of our providers in the next 365 days. If you have a MyChart account, a copy of this consent can be sent to you electronically.  As this is a virtual visit, video technology does not allow for your provider to perform a traditional examination. This may limit your provider's ability to fully assess your condition. If your provider identifies any concerns that need to be evaluated in person or the need to arrange testing (such as labs, EKG, etc.), we will make arrangements to do so. Although advances in technology are sophisticated, we cannot ensure that it will always work on either your end or our end. If the connection with a video visit is poor, the visit may have to be switched to a telephone visit. With either a video or telephone visit, we are not always able to ensure that we have a secure connection.  By engaging in this virtual visit, you consent to the provision of healthcare and authorize for your insurance to be billed (if applicable) for the services provided during this visit. Depending on your insurance coverage, you may receive a charge related to this service.  I need to obtain your verbal consent now. Are you willing to proceed with your visit today? Annelyse A Holtsclaw has provided verbal consent on 12/09/2023 for a virtual visit (video or telephone). Georgana Curio, FNP  Date: 12/09/2023 7:02 PM   Virtual Visit via Video Note   I, Georgana Curio, connected with  FLOIS MCTAGUE  ([id number], February 04, 1996) on 12/09/23 at  7:00 PM EST by a video-enabled telemedicine application and verified that I am speaking with the correct person using two identifiers.  Location: Patient: Virtual Visit  Location Patient: Home Provider: Virtual Visit Location Provider: Home Office   I discussed the limitations of evaluation and management by telemedicine and the availability of in person appointments. The patient expressed understanding and agreed to proceed.    History of Present Illness: Madison Gates is a 28 y.o. who identifies as a female who was assigned female at birth, and is being seen today for sinus pressure, post nasal drainage, cough, wheezing, no fever. Sx for 2 days. Marland Kitchen  HPI: HPI  Problems:  Patient Active Problem List   Diagnosis Date Noted   Gastroesophageal reflux disease without esophagitis 02/05/2020   Hydradenitis 02/04/2020   Abnormal uterine bleeding 03/25/2016   Morbid obesity ([facility]) 03/25/2016   Acanthosis nigricans 03/25/2016    Allergies:  Allergies  Allergen Reactions   Concerta [Methylphenidate]     Mood swings   Medications:  Current Outpatient Medications:    azelastine (ASTELIN) 0.1 % nasal spray, Place 1 spray into both nostrils 2 (two) times daily. Use in each nostril as directed, Disp: 30 mL, Rfl: 0  Observations/Objective: Patient is well-developed, well-nourished in no acute distress.  Resting comfortably  at home.  Head is normocephalic, atraumatic.  No labored breathing.  Speech is clear and coherent with logical content.  Patient is alert and oriented at baseline.    Assessment and Plan: 1. Upper respiratory tract infection, unspecified type (Primary)  Increase fluids, humidifier at night, tylenol, start flonase and claritin. UC if sx persist or worsen   Follow Up Instructions: I  discussed the assessment and treatment plan with the patient. The patient was provided an opportunity to ask questions and all were answered. The patient agreed with the plan and demonstrated an understanding of the instructions.  A copy of instructions were sent to the patient via MyChart unless otherwise noted below.     The patient was advised to  call back or seek an in-person evaluation if the symptoms worsen or if the condition fails to improve as anticipated.    Georgana Curio, FNP

## 2023-12-09 NOTE — Progress Notes (Signed)
E-Visit for Sinus Problems  We are sorry that you are not feeling well.  Here is how we plan to help!  Based on what you have shared with me it looks like you have sinusitis.  Sinusitis is inflammation and infection in the sinus cavities of the head.  Based on your presentation I believe you most likely have Acute Viral Sinusitis.This is an infection most likely caused by a virus. There is not specific treatment for viral sinusitis other than to help you with the symptoms until the infection runs its course.  You may use an oral decongestant such as Mucinex D or if you have glaucoma or high blood pressure use plain Mucinex. Saline nasal spray help and can safely be used as often as needed for congestion, I have prescribed: Azelastine nasal spray 2 sprays in each nostril twice a day  Some authorities believe that zinc sprays or the use of Echinacea may shorten the course of your symptoms.  Sinus infections are not as easily transmitted as other respiratory infection, however we still recommend that you avoid close contact with loved ones, especially the very young and elderly.  Remember to wash your hands thoroughly throughout the day as this is the number one way to prevent the spread of infection!  Home Care: Only take medications as instructed by your medical team. Do not take these medications with alcohol. A steam or ultrasonic humidifier can help congestion.  You can place a towel over your head and breathe in the steam from hot water coming from a faucet. Avoid close contacts especially the very young and the elderly. Cover your mouth when you cough or sneeze. Always remember to wash your hands.  Get Help Right Away If: You develop worsening fever or sinus pain. You develop a severe head ache or visual changes. Your symptoms persist after you have completed your treatment plan.  Make sure you Understand these instructions. Will watch your condition. Will get help right away if you are  not doing well or get worse.   Thank you for choosing an e-visit.  Your e-visit answers were reviewed by a board certified advanced clinical practitioner to complete your personal care plan. Depending upon the condition, your plan could have included both over the counter or prescription medications.  Please review your pharmacy choice. Make sure the pharmacy is open so you can pick up prescription now. If there is a problem, you may contact your provider through MyChart messaging and have the prescription routed to another pharmacy.  Your safety is important to us. If you have drug allergies check your prescription carefully.   For the next 24 hours you can use MyChart to ask questions about today's visit, request a non-urgent call back, or ask for a work or school excuse. You will get an email in the next two days asking about your experience. I hope that your e-visit has been valuable and will speed your recovery.   

## 2023-12-09 NOTE — Progress Notes (Signed)
 I have spent 5 minutes in review of e-visit questionnaire, review and updating patient chart, medical decision making and response to patient.   Piedad Climes, PA-C

## 2023-12-09 NOTE — Patient Instructions (Signed)

## 2024-06-11 ENCOUNTER — Telehealth

## 2024-06-11 DIAGNOSIS — J069 Acute upper respiratory infection, unspecified: Secondary | ICD-10-CM

## 2024-06-11 MED ORDER — PREDNISONE 20 MG PO TABS: 40.0000 mg | ORAL_TABLET | Freq: Every day | ORAL | 0 refills | Status: DC

## 2024-06-11 MED ORDER — IPRATROPIUM BROMIDE 0.03 % NA SOLN: 2.0000 | Freq: Two times a day (BID) | NASAL | 0 refills | Status: AC

## 2024-06-11 NOTE — Patient Instructions (Signed)
 Neville DELENA Kleine, thank you for joining Delon CHRISTELLA Dickinson, PA-C for today's virtual visit.  While this provider is not your primary care provider (PCP), if your PCP is located in our provider database this encounter information will be shared with them immediately following your visit.   A [facility] MyChart account gives you access to today's visit and all your visits, tests, and labs performed at [facility]  click here if you don't have a [facility] MyChart account or go to [email or web address]  Consent: (Patient) Madison Gates provided verbal consent for this virtual visit at the beginning of the encounter.  Current Medications:  Current Outpatient Medications:    ipratropium (ATROVENT ) 0.03 % nasal spray, Place 2 sprays into both nostrils every 12 (twelve) hours., Disp: 30 mL, Rfl: 0   predniSONE  (DELTASONE ) 20 MG tablet, Take 2 tablets (40 mg total) by mouth daily with breakfast., Disp: 10 tablet, Rfl: 0   Medications ordered in this encounter:  Meds ordered this encounter  Medications   ipratropium (ATROVENT ) 0.03 % nasal spray    Sig: Place 2 sprays into both nostrils every 12 (twelve) hours.    Dispense:  30 mL    Refill:  0    Supervising Provider:   LAMPTEY, PHILIP O [id number]   predniSONE  (DELTASONE ) 20 MG tablet    Sig: Take 2 tablets (40 mg total) by mouth daily with breakfast.    Dispense:  10 tablet    Refill:  0    Supervising Provider:   BLAISE ALEENE KIDD [[id number]]     *If you need refills on other medications prior to your next appointment, please contact your pharmacy*  Follow-Up: Call back or seek an in-person evaluation if the symptoms worsen or if the condition fails to improve as anticipated.  [facility] [phone]  Other Instructions Viral Respiratory Infection A respiratory infection is an illness that affects part of the respiratory system, such as the lungs, nose, or throat. A respiratory infection  that is caused by a virus is called a viral respiratory infection. Common types of viral respiratory infections include: A cold. The flu (influenza). A respiratory syncytial virus (RSV) infection. What are the causes? This condition is caused by a virus. The virus may spread through contact with droplets or direct contact with infected people or their mucus or secretions. The virus may spread from person to person (is contagious). What are the signs or symptoms? Symptoms of this condition include: A stuffy or runny nose. A sore throat or cough. Shortness of breath or difficulty breathing. Yellow or green mucus (sputum). Other symptoms may include: A fever. Sweating or chills. Fatigue. Achy muscles. A headache. How is this diagnosed? This condition may be diagnosed based on: Your symptoms. A physical exam. Testing of secretions from the nose or throat. Chest X-ray. How is this treated? This condition may be treated with medicines, such as: Antiviral medicine. This may shorten the length of time a person has symptoms. Expectorants. These make it easier to cough up mucus. Decongestant nasal sprays. Acetaminophen  or NSAIDs, such as ibuprofen , to relieve fever and pain. Antibiotic medicines are not prescribed for viral infections.This is because antibiotics are designed to kill bacteria. They do not kill viruses. Follow these instructions at home: Managing pain and congestion Take over-the-counter and prescription medicines only as told by your health care provider. If you have a sore throat, gargle with a mixture of salt and water 3-4 times a day or as needed.  To make salt water, completely dissolve -1 tsp (3-6 g) of salt in 1 cup (237 mL) of warm water. Use nose drops made from salt water to ease congestion and soften raw skin around your nose. Take 2 tsp (10 mL) of honey at bedtime to lessen coughing at night. Do not give honey to children who are younger than 1 year. Drink  enough fluid to keep your urine pale yellow. This helps prevent dehydration and helps loosen up mucus. General instructions  Rest as much as possible. Do not drink alcohol. Do not use any products that contain nicotine or tobacco. These products include cigarettes, chewing tobacco, and vaping devices, such as e-cigarettes. If you need help quitting, ask your health care provider. Keep all follow-up visits. This is important. How is this prevented?     Get an annual flu shot. You may get the flu shot in late summer, fall, or winter. Ask your health care provider when you should get your flu shot. Avoid spreading your infection to other people. If you are sick: Wash your hands with soap and water often, especially after you cough or sneeze. Wash for at least 20 seconds. If soap and water are not available, use alcohol-based hand sanitizer. Cover your mouth when you cough. Cover your nose and mouth when you sneeze. Do not share cups or eating utensils. Clean commonly used objects often. Clean commonly touched surfaces. Stay home from work or school as told by your health care provider. Avoid contact with people who are sick during cold and flu season. This is generally fall and winter. Contact a health care provider if: Your symptoms last for 10 days or longer. Your symptoms get worse over time. You have severe sinus pain in your face or forehead. The glands in your jaw or neck become very swollen. You have shortness of breath. Get help right away if you: Feel pain or pressure in your chest. Have trouble breathing. Faint or feel like you will faint. Have severe and persistent vomiting. Feel confused or disoriented. These symptoms may represent a serious problem that is an emergency. Do not wait to see if the symptoms will go away. Get medical help right away. Call your local emergency services (911 in the [place] Do not drive yourself to the hospital. Summary A respiratory infection is  an illness that affects part of the respiratory system, such as the lungs, nose, or throat. A respiratory infection that is caused by a virus is called a viral respiratory infection. Common types of viral respiratory infections include a cold, influenza, and respiratory syncytial virus (RSV) infection. Symptoms of this condition include a stuffy or runny nose, cough, fatigue, achy muscles, sore throat, and fevers or chills. Antibiotic medicines are not prescribed for viral infections. This is because antibiotics are designed to kill bacteria. They are not effective against viruses. This information is not intended to replace advice given to you by your health care provider. Make sure you discuss any questions you have with your health care provider. Document Revised: 12/28/2020 Document Reviewed: 12/28/2020 Elsevier Patient Education  2024 Elsevier Inc[employer]   If you have been instructed to have an in-person evaluation today at a local Urgent Care facility, please use the link below. It will take you to a list of all of our available [facility], including address, phone number and hours of operation. Please do not delay care.  [facility]  If you or a family member do not have a  primary care provider, use the link below to schedule a visit and establish care. When you choose a [facility] primary care physician or advanced practice provider, you gain a long-term partner in health. Find a Primary Care Provider  Learn more about [facility]'s in-office and virtual care options: [facility] - Get Care Now

## 2024-06-11 NOTE — Progress Notes (Signed)
 Virtual Visit Consent   Madison Gates, you are scheduled for a virtual visit with a [facility] provider today. Just as with appointments in the office, your consent must be obtained to participate. Your consent will be active for this visit and any virtual visit you may have with one of our providers in the next 365 days. If you have a MyChart account, a copy of this consent can be sent to you electronically.  As this is a virtual visit, video technology does not allow for your provider to perform a traditional examination. This may limit your provider's ability to fully assess your condition. If your provider identifies any concerns that need to be evaluated in person or the need to arrange testing (such as labs, EKG, etc.), we will make arrangements to do so. Although advances in technology are sophisticated, we cannot ensure that it will always work on either your end or our end. If the connection with a video visit is poor, the visit may have to be switched to a telephone visit. With either a video or telephone visit, we are not always able to ensure that we have a secure connection.  By engaging in this virtual visit, you consent to the provision of healthcare and authorize for your insurance to be billed (if applicable) for the services provided during this visit. Depending on your insurance coverage, you may receive a charge related to this service.  I need to obtain your verbal consent now. Are you willing to proceed with your visit today? Madison Gates has provided verbal consent on 06/11/2024 for a virtual visit (video or telephone). Madison CHRISTELLA Dickinson, PA-C  Date: 06/11/2024 9:41 AM   Virtual Visit via Video Note   I, Madison Gates, connected with  Madison Gates  ([id number], 1995/10/28) on 06/11/24 at  9:30 AM EDT by a video-enabled telemedicine application and verified that I am speaking with the correct person using two identifiers.  Location: Patient:  Virtual Visit Location Patient: Home Provider: Virtual Visit Location Provider: Home Office   I discussed the limitations of evaluation and management by telemedicine and the availability of in person appointments. The patient expressed understanding and agreed to proceed.    History of Present Illness: Madison Gates is a 28 y.o. who identifies as a female who was assigned female at birth, and is being seen today for sinus congestion.  HPI: URI  This is a new problem. The current episode started in the past 7 days (3-4 days). The problem has been unchanged. Maximum temperature: subjective fevers-chills, sweats and aching. The fever has been present for Less than 1 day. Associated symptoms include congestion, coughing (mixed), headaches, rhinorrhea (and post nasal drainage), sinus pain and a sore throat (initially, now improved). Pertinent negatives include no chest pain, diarrhea, ear pain, nausea, plugged ear sensation, sneezing, vomiting or wheezing. Treatments tried: tylenol  cold and flu, alka seltzer. The treatment provided mild relief.     Problems:  Patient Active Problem List   Diagnosis Date Noted   Gastroesophageal reflux disease without esophagitis 02/05/2020   Hydradenitis 02/04/2020   Abnormal uterine bleeding 03/25/2016   Morbid obesity (HCC) 03/25/2016   Acanthosis nigricans 03/25/2016    Allergies:  Allergies  Allergen Reactions   Concerta [Methylphenidate]     Mood swings   Medications:  Current Outpatient Medications:    ipratropium (ATROVENT ) 0.03 % nasal spray, Place 2 sprays into both nostrils every 12 (twelve) hours., Disp: 30 mL, Rfl: 0  predniSONE  (DELTASONE ) 20 MG tablet, Take 2 tablets (40 mg total) by mouth daily with breakfast., Disp: 10 tablet, Rfl: 0  Observations/Objective: Patient is well-developed, well-nourished in no acute distress.  Resting comfortably at home.  Head is normocephalic, atraumatic.  No labored breathing. Speech is clear  and coherent with logical content.  Patient is alert and oriented at baseline.    Assessment and Plan: 1. Viral URI with cough (Primary) - ipratropium (ATROVENT ) 0.03 % nasal spray; Place 2 sprays into both nostrils every 12 (twelve) hours.  Dispense: 30 mL; Refill: 0 - predniSONE  (DELTASONE ) 20 MG tablet; Take 2 tablets (40 mg total) by mouth daily with breakfast.  Dispense: 10 tablet; Refill: 0  - Suspect viral URI - Symptomatic medications of choice over the counter as needed - Prednisone  added for congestion and inflammation - Ipratropium Bromide  for nasal congestion and drainage - Push fluids - Rest - Work note provided - Seek further evaluation if symptoms change or worsen   Follow Up Instructions: I discussed the assessment and treatment plan with the patient. The patient was provided an opportunity to ask questions and all were answered. The patient agreed with the plan and demonstrated an understanding of the instructions.  A copy of instructions were sent to the patient via MyChart unless otherwise noted below.    The patient was advised to call back or seek an in-person evaluation if the symptoms worsen or if the condition fails to improve as anticipated.    Madison CHRISTELLA Dickinson, PA-C

## 2024-09-08 ENCOUNTER — Telehealth: Admitting: Family Medicine

## 2024-09-08 DIAGNOSIS — J069 Acute upper respiratory infection, unspecified: Secondary | ICD-10-CM

## 2024-09-08 MED ORDER — PREDNISONE 20 MG PO TABS: 40.0000 mg | ORAL_TABLET | Freq: Every day | ORAL | 0 refills | Status: AC

## 2024-09-08 MED ORDER — FLUTICASONE PROPIONATE 50 MCG/ACT NA SUSP: 2.0000 | Freq: Every day | NASAL | 0 refills | Status: AC

## 2024-09-08 MED ORDER — BENZONATATE 100 MG PO CAPS: 100.0000 mg | ORAL_CAPSULE | Freq: Three times a day (TID) | ORAL | 0 refills | Status: AC | PRN

## 2024-09-08 NOTE — Addendum Note (Signed)
 Addended by: GLADIS ELSIE BROCKS on: 09/08/2024 01:40 PM   Modules accepted: Orders

## 2024-09-08 NOTE — Progress Notes (Signed)

## 2024-10-06 ENCOUNTER — Telehealth: Admitting: Nurse Practitioner

## 2024-10-06 DIAGNOSIS — J019 Acute sinusitis, unspecified: Secondary | ICD-10-CM

## 2024-10-06 DIAGNOSIS — B9789 Other viral agents as the cause of diseases classified elsewhere: Secondary | ICD-10-CM

## 2024-10-06 MED ORDER — IPRATROPIUM BROMIDE 0.03 % NA SOLN: 2.0000 | Freq: Two times a day (BID) | NASAL | 12 refills | Status: AC

## 2024-10-06 NOTE — Progress Notes (Signed)
 We are sorry that you are not feeling well.  Here is how we plan to help!  Based on what you have shared with me it looks like you have sinusitis.  Sinusitis is inflammation and infection in the sinus cavities of the head.  Based on your presentation I believe you most likely have Acute Viral Sinusitis.This is an infection most likely caused by a virus. There is not specific treatment for viral sinusitis other than to help you with the symptoms until the infection runs its course.  You may use an oral decongestant such as Mucinex D or if you have glaucoma or high blood pressure use plain Mucinex. Saline nasal spray help and can safely be used as often as needed for congestion, I have prescribed: Ipratropium Bromide  nasal spray 0.03% 2 sprays in eah nostril 2-3 times a day Providers prescribe antibiotics to treat infections caused by bacteria. Antibiotics are very powerful in treating bacterial infections when they are used properly. To maintain their effectiveness, they should be used only when necessary. Overuse of antibiotics has resulted in the development of superbugs that are resistant to treatment!    After careful review of your answers, I would not recommend an antibiotic for your condition.  Antibiotics are not effective against viruses and therefore should not be used to treat them. Common examples of infections caused by viruses include colds and flu   Some authorities believe that zinc sprays or the use of Echinacea may shorten the course of your symptoms.  Sinus infections are not as easily transmitted as other respiratory infection, however we still recommend that you avoid close contact with loved ones, especially the very young and elderly.  Remember to wash your hands thoroughly throughout the day as this is the number one way to prevent the spread of infection!  Home Care: Only take medications as instructed by your medical team. Do not take these medications with alcohol. A steam or  ultrasonic humidifier can help congestion.  You can place a towel over your head and breathe in the steam from hot water coming from a faucet. Avoid close contacts especially the very young and the elderly. Cover your mouth when you cough or sneeze. Always remember to wash your hands.  Get Help Right Away If: You develop worsening fever or sinus pain. You develop a severe head ache or visual changes. Your symptoms persist after you have completed your treatment plan.  Make sure you Understand these instructions. Will watch your condition. Will get help right away if you are not doing well or get worse.  Your e-visit answers were reviewed by a board certified advanced clinical practitioner to complete your personal care plan.  Depending on the condition, your plan could have included both over the counter or prescription medications.  If there is a problem please reply  once you have received a response from your provider.  Your safety is important to us .  If you have drug allergies check your prescription carefully.    You can use MyChart to ask questions about todays visit, request a non-urgent call back, or ask for a work or school excuse for 24 hours related to this e-Visit. If it has been greater than 24 hours you will need to follow up with your provider, or enter a new e-Visit to address those concerns.  You will get an e-mail in the next two days asking about your experience.  I hope that your e-visit has been valuable and will speed your recovery.  Thank you for using e-visits.  I have spent 5 minutes in review of e-visit questionnaire, review and updating patient chart, medical decision making and response to patient.   Lauraine Kitty, FNP
